# Patient Record
Sex: Male | Born: 1990 | Hispanic: Yes | Marital: Single | Smoking: Former smoker
Health system: Southern US, Community
[De-identification: ages and names within clinical notes are randomized; demographics above are authoritative.]

## PROBLEM LIST (undated history)

## (undated) DIAGNOSIS — R809 Proteinuria, unspecified: Secondary | ICD-10-CM

## (undated) DIAGNOSIS — J039 Acute tonsillitis, unspecified: Secondary | ICD-10-CM

## (undated) DIAGNOSIS — K219 Gastro-esophageal reflux disease without esophagitis: Secondary | ICD-10-CM

## (undated) DIAGNOSIS — R0609 Other forms of dyspnea: Secondary | ICD-10-CM

## (undated) DIAGNOSIS — R748 Abnormal levels of other serum enzymes: Secondary | ICD-10-CM

## (undated) DIAGNOSIS — R7303 Prediabetes: Secondary | ICD-10-CM

## (undated) DIAGNOSIS — R739 Hyperglycemia, unspecified: Secondary | ICD-10-CM

## (undated) DIAGNOSIS — M8588 Other specified disorders of bone density and structure, other site: Secondary | ICD-10-CM

## (undated) DIAGNOSIS — I517 Cardiomegaly: Secondary | ICD-10-CM

## (undated) DIAGNOSIS — R824 Acetonuria: Secondary | ICD-10-CM

## (undated) HISTORY — DX: Other specified disorders of bone density and structure, other site: M85.88

## (undated) HISTORY — DX: Gastro-esophageal reflux disease without esophagitis: K21.9

## (undated) HISTORY — DX: Acute tonsillitis, unspecified: J03.90

## (undated) HISTORY — DX: Prediabetes: R73.03

## (undated) HISTORY — DX: Hyperglycemia, unspecified: R73.9

## (undated) HISTORY — DX: Hypocalcemia: E83.51

## (undated) HISTORY — DX: Abnormal levels of other serum enzymes: R74.8

## (undated) HISTORY — PX: TONSILLECTOMY: SUR1361

## (undated) HISTORY — DX: Other forms of dyspnea: R06.09

## (undated) HISTORY — DX: Cardiomegaly: I51.7

## (undated) HISTORY — DX: Proteinuria, unspecified: R80.9

## (undated) HISTORY — DX: Acetonuria: R82.4

---

## 2011-05-25 ENCOUNTER — Emergency Department (HOSPITAL_COMMUNITY): Payer: Self-pay

## 2011-05-25 ENCOUNTER — Emergency Department (HOSPITAL_COMMUNITY)
Admission: EM | Admit: 2011-05-25 | Discharge: 2011-05-26 | Disposition: A | Discharge status: Home / Self Care | Payer: Self-pay | Attending: Emergency Medicine | Admitting: Emergency Medicine

## 2011-05-25 DIAGNOSIS — M25439 Effusion, unspecified wrist: Secondary | ICD-10-CM | POA: Insufficient documentation

## 2011-05-25 DIAGNOSIS — S139XXA Sprain of joints and ligaments of unspecified parts of neck, initial encounter: Secondary | ICD-10-CM | POA: Insufficient documentation

## 2011-05-25 DIAGNOSIS — R51 Headache: Secondary | ICD-10-CM | POA: Insufficient documentation

## 2011-05-25 DIAGNOSIS — M542 Cervicalgia: Secondary | ICD-10-CM | POA: Insufficient documentation

## 2011-05-25 DIAGNOSIS — M25519 Pain in unspecified shoulder: Secondary | ICD-10-CM | POA: Insufficient documentation

## 2011-05-25 DIAGNOSIS — H5789 Other specified disorders of eye and adnexa: Secondary | ICD-10-CM | POA: Insufficient documentation

## 2011-05-25 DIAGNOSIS — M25539 Pain in unspecified wrist: Secondary | ICD-10-CM | POA: Insufficient documentation

## 2011-05-25 DIAGNOSIS — R079 Chest pain, unspecified: Secondary | ICD-10-CM | POA: Insufficient documentation

## 2011-05-25 DIAGNOSIS — S0510XA Contusion of eyeball and orbital tissues, unspecified eye, initial encounter: Secondary | ICD-10-CM | POA: Insufficient documentation

## 2011-05-25 DIAGNOSIS — S022XXA Fracture of nasal bones, initial encounter for closed fracture: Secondary | ICD-10-CM | POA: Insufficient documentation

## 2011-05-25 DIAGNOSIS — R109 Unspecified abdominal pain: Secondary | ICD-10-CM | POA: Insufficient documentation

## 2011-05-25 DIAGNOSIS — S0180XA Unspecified open wound of other part of head, initial encounter: Secondary | ICD-10-CM | POA: Insufficient documentation

## 2011-05-25 DIAGNOSIS — S63509A Unspecified sprain of unspecified wrist, initial encounter: Secondary | ICD-10-CM | POA: Insufficient documentation

## 2011-05-25 DIAGNOSIS — R10819 Abdominal tenderness, unspecified site: Secondary | ICD-10-CM | POA: Insufficient documentation

## 2011-05-25 DIAGNOSIS — IMO0002 Reserved for concepts with insufficient information to code with codable children: Secondary | ICD-10-CM | POA: Insufficient documentation

## 2011-05-25 LAB — ETHANOL: Alcohol, Ethyl (B): <11 mg/dL (ref 0–11)

## 2011-05-25 LAB — POCT I-STAT, CHEM 8
BUN: 21 mg/dL (ref 6–23)
Chloride: 109 mEq/L (ref 96–112)
Creatinine, Ser: 1 mg/dL (ref 0.50–1.35)
Sodium: 143 mEq/L (ref 135–145)
TCO2: 21 mmol/L (ref 0–100)

## 2011-05-25 LAB — CBC
HCT: 42.3 % (ref 39.0–52.0)
Hemoglobin: 15 g/dL (ref 13.0–17.0)
MCH: 29.2 pg (ref 26.0–34.0)
MCHC: 35.5 g/dL (ref 30.0–36.0)
MCV: 82.3 fL (ref 78.0–100.0)

## 2011-05-25 LAB — DIFFERENTIAL
Basophils Relative: 0 % (ref 0–1)
Lymphs Abs: 2.5 10*3/uL (ref 0.7–4.0)
Monocytes Absolute: 1.4 10*3/uL — ABNORMAL HIGH (ref 0.1–1.0)
Monocytes Relative: 9 % (ref 3–12)
Neutro Abs: 12.9 10*3/uL — ABNORMAL HIGH (ref 1.7–7.7)

## 2011-05-25 MED ORDER — IOHEXOL 300 MG/ML  SOLN
100.0000 mL | Freq: Once | INTRAMUSCULAR | Status: AC | PRN
  Administered 2011-05-25: 100 mL via INTRAVENOUS

## 2018-01-24 ENCOUNTER — Other Ambulatory Visit: Payer: Self-pay

## 2018-01-24 ENCOUNTER — Emergency Department (HOSPITAL_COMMUNITY)
Admission: EM | Admit: 2018-01-24 | Discharge: 2018-01-24 | Disposition: A | Discharge status: Home / Self Care | Payer: Self-pay | Attending: Emergency Medicine | Admitting: Emergency Medicine

## 2018-01-24 ENCOUNTER — Encounter (HOSPITAL_COMMUNITY): Payer: Self-pay

## 2018-01-24 DIAGNOSIS — J029 Acute pharyngitis, unspecified: Secondary | ICD-10-CM | POA: Insufficient documentation

## 2018-01-24 DIAGNOSIS — S00502A Unspecified superficial injury of oral cavity, initial encounter: Secondary | ICD-10-CM | POA: Insufficient documentation

## 2018-01-24 DIAGNOSIS — Z87891 Personal history of nicotine dependence: Secondary | ICD-10-CM | POA: Insufficient documentation

## 2018-01-24 DIAGNOSIS — S0993XA Unspecified injury of face, initial encounter: Secondary | ICD-10-CM

## 2018-01-24 DIAGNOSIS — Y33XXXA Other specified events, undetermined intent, initial encounter: Secondary | ICD-10-CM | POA: Insufficient documentation

## 2018-01-24 DIAGNOSIS — J351 Hypertrophy of tonsils: Secondary | ICD-10-CM | POA: Insufficient documentation

## 2018-01-24 DIAGNOSIS — Y998 Other external cause status: Secondary | ICD-10-CM | POA: Insufficient documentation

## 2018-01-24 DIAGNOSIS — Y939 Activity, unspecified: Secondary | ICD-10-CM | POA: Insufficient documentation

## 2018-01-24 DIAGNOSIS — Y929 Unspecified place or not applicable: Secondary | ICD-10-CM | POA: Insufficient documentation

## 2018-01-24 LAB — CBC WITH DIFFERENTIAL/PLATELET
Basophils Absolute: 0 10*3/uL (ref 0.0–0.1)
Basophils Relative: 0 %
Eosinophils Absolute: 0.1 10*3/uL (ref 0.0–0.7)
Eosinophils Relative: 1 %
HCT: 48 % (ref 39.0–52.0)
HEMOGLOBIN: 16.1 g/dL (ref 13.0–17.0)
LYMPHS ABS: 3.5 10*3/uL (ref 0.7–4.0)
LYMPHS PCT: 35 %
MCH: 28.4 pg (ref 26.0–34.0)
MCHC: 33.5 g/dL (ref 30.0–36.0)
MCV: 84.7 fL (ref 78.0–100.0)
Monocytes Absolute: 0.9 10*3/uL (ref 0.1–1.0)
Monocytes Relative: 9 %
NEUTROS PCT: 55 %
Neutro Abs: 5.7 10*3/uL (ref 1.7–7.7)
Platelets: 207 10*3/uL (ref 150–400)
RBC: 5.67 MIL/uL (ref 4.22–5.81)
RDW: 13.6 % (ref 11.5–15.5)
WBC: 10.2 10*3/uL (ref 4.0–10.5)

## 2018-01-24 MED ORDER — SALINE SPRAY 0.65 % NA SOLN: 1.0000 | NASAL | 0 refills | Status: DC | PRN

## 2018-01-24 MED ORDER — DEXAMETHASONE SODIUM PHOSPHATE 10 MG/ML IJ SOLN
10.0000 mg | Freq: Once | INTRAMUSCULAR | Status: AC
  Administered 2018-01-24: 10 mg via INTRAMUSCULAR
  Filled 2018-01-24: qty 1

## 2018-01-24 MED ORDER — MAGIC MOUTHWASH W/LIDOCAINE: 5.0000 mL | Freq: Three times a day (TID) | ORAL | 0 refills | Status: DC | PRN

## 2018-01-24 NOTE — ED Provider Notes (Signed)
I received a call from [facility] regarding the formulation for Magic mouthwash with lidocaine.  I advised that patient not need the Maalox, but to fill the rest of the normal formulation with lidocaine.   Emi Holes, PA-C 01/24/18 2051    Shaune Pollack, MD 01/25/18 [phone]

## 2018-01-24 NOTE — ED Provider Notes (Signed)
[facility]-EMERGENCY DEPT Provider Note   CSN: [id number] Arrival date & time: 01/24/18  4098     History   Chief Complaint Chief Complaint  Patient presents with  . Sore Throat    HPI Riley Zavala is a 27 y.o. male who presents to the emergency department with a chief complaint of sore throat for the last 3 months.  He reports that he was seen at a medical clinic on [facility] about 1 month ago and was given antibiotics for his sore throat.  He states that he is scheduled with a surgeon at a clinic that he is unsure of the name of her unsure of the surgeons name in [place] to have a tonsillectomy on May 15.  He reports that he has gotten more afraid of having the surgery due to the anesthesia as the day gets closer.  He is concerned about his blood pressure.  He does not have a primary care provider.  He reports that his sore throat is gradually worsened over the last 3 months.  He denies muffled voice, drooling, trismus, neck or facial swelling, fever, chills, respiratory distress, or feeling as if his throat is closing.  He reports increased pain with swallowing food, but reports he has been drinking well.  No weight loss.  He also endorses a painful rash to the roof of the mouth for the last month on the soft palate.  Pain is worse with eating food.  No systemic rash, fever, chills, otalgia, visual changes.  No sick contacts.  No chronic medical problems or daily medications.  Spanish interpreter used.  The patient is a poor historian.  The history is provided by the patient and a relative. A language interpreter was used.    History reviewed. No pertinent past medical history.  There are no active problems to display for this patient.   History reviewed. No pertinent surgical history.      Home Medications    Prior to Admission medications   Medication Sig Start Date End Date Taking? Authorizing Provider  magic mouthwash  w/lidocaine SOLN Take 5 mLs by mouth 3 (three) times daily as needed for mouth pain. 01/24/18   Gunnard Dorrance A, PA-C  sodium chloride (OCEAN) 0.65 % SOLN nasal spray Place 1 spray into both nostrils as needed for congestion. 01/24/18   Jianni Shelden A, PA-C    Family History Family History  Problem Relation Age of Onset  . Diabetes Mother     Social History Social History   Tobacco Use  . Smoking status: Former Games developer  . Smokeless tobacco: Never Used  Substance Use Topics  . Alcohol use: Not Currently  . Drug use: Not Currently     Allergies   Patient has no known allergies.   Review of Systems Review of Systems  Constitutional: Negative for activity change, appetite change and fever.  HENT: Positive for sore throat. Negative for congestion, drooling, facial swelling, mouth sores, postnasal drip, rhinorrhea, sinus pressure, sinus pain, tinnitus, trouble swallowing and voice change.   Eyes: Negative for visual disturbance.  Respiratory: Negative for shortness of breath.   Cardiovascular: Negative for chest pain.  Gastrointestinal: Negative for abdominal pain, diarrhea, nausea and vomiting.  Genitourinary: Negative for dysuria.  Musculoskeletal: Negative for back pain.  Skin: Positive for rash.  Allergic/Immunologic: Negative for immunocompromised state.  Neurological: Negative for headaches.  Psychiatric/Behavioral: Negative for confusion.     Physical Exam Updated Vital Signs BP 117/80   Pulse 66  Temp 98.5 F (36.9 C) (Oral)   Resp 18   Wt 91.2 kg (201 lb)   SpO2 98%   Physical Exam  Constitutional: He appears well-developed and well-nourished. He does not appear ill. No distress.  HENT:  Head: Normocephalic.  Right Ear: Tympanic membrane and ear canal normal.  Left Ear: Tympanic membrane and ear canal normal.  Mouth/Throat: Uvula is midline, oropharynx is clear and moist and mucous membranes are normal. No oral lesions. No uvula swelling. No oropharyngeal  exudate, posterior oropharyngeal edema, posterior oropharyngeal erythema or tonsillar abscesses. Tonsils are 2+ on the right. Tonsils are 2+ on the left. No tonsillar exudate.  No sublingual edema or induration.  There is cobblestoning present to the soft palate.  It does not extend to the hard palate or posterior oropharynx.  No anterior posterior cervical lymphadenopathy.   Tonsils are 2+ and symmetric.  Swallowing without difficulty.  No trismus.  Uvula is midline.  Eyes: Pupils are equal, round, and reactive to light. Conjunctivae and EOM are normal.  Neck: Normal range of motion. Neck supple. No thyromegaly present.  Cardiovascular: Normal rate, regular rhythm, normal heart sounds and intact distal pulses. Exam reveals no gallop and no friction rub.  No murmur heard. Pulmonary/Chest: Effort normal and breath sounds normal. No stridor. No respiratory distress. He has no wheezes. He has no rales. He exhibits no tenderness.  Speaking in complete fluent sentences.  No tachypnea.  Abdominal: Soft. He exhibits no distension and no mass. There is no tenderness. There is no rebound and no guarding. No hernia.  Lymphadenopathy:    He has no cervical adenopathy.  Neurological: He is alert.  Skin: Skin is warm and dry. Capillary refill takes less than 2 seconds. No rash noted.  No rash or lesions aside from the soft palate.  Psychiatric: His behavior is normal.  Nursing note and vitals reviewed.    ED Treatments / Results  Labs (all labs ordered are listed, but only abnormal results are displayed) Labs Reviewed  RAPID STREP SCREEN ([facility] & [facility] ONLY)  CBC WITH DIFFERENTIAL/PLATELET    EKG None  Radiology No results found.  Procedures Procedures (including critical care time)  Medications Ordered in ED Medications  dexamethasone (DECADRON) injection 10 mg (10 mg Intramuscular Given 01/24/18 1519)     Initial Impression / Assessment and Plan / ED Course  I have reviewed the triage  vital signs and the nursing notes.  Pertinent labs & imaging results that were available during my care of the patient were reviewed by me and considered in my medical decision making (see chart for details).     27 year old male who is a poor historian presenting with sore throat for the last 3 months and irritation to the soft palate for the last month.  He is scheduled for a tonsillectomy on May 15 by an ENT surgeon in [place], but he is unsure of the surgeon's name or the name of the practice.  On exam, he has mild cobblestoning to the soft palate.  Tonsils are 2+ and symmetric.  No appreciable anterior posterior lymphadenopathy.  His symptoms appear chronic.  He is tolerating fluids and eating without difficulty in the ED.  No sublingual induration or edema.  Doubt Ludwig's angina, peritonsillar abscess, retropharyngeal abscess.  Rapid strep is negative.  No leukocytosis or elevated neutrophil count or bandemia on CBC.  The patient was assessed along with Dr. Clarice Pole, attending physician due to cobblestoning on the soft palate.  We will discharge  the patient to home with Magic mouthwash and soft diet to see if his symptoms improve over the next week.  Recommended following up with his surgeon for reevaluation prior to his surgery on May 15.  He is hemodynamically stable at this time and in no acute distress.  He is safe for discharge to home with outpatient follow-up.  Final Clinical Impressions(s) / ED Diagnoses   Final diagnoses:  Enlarged tonsils  Soft palate injury, initial encounter    ED Discharge Orders        Ordered    sodium chloride (OCEAN) 0.65 % SOLN nasal spray  As needed     01/24/18 1608    magic mouthwash w/lidocaine SOLN  3 times daily PRN     01/24/18 1608       Emerly Prak A, PA-C 01/24/18 1729    Arby Barrette, MD 01/28/18 1447

## 2018-01-24 NOTE — ED Triage Notes (Addendum)
Patient c/o sore throat and tongue swelling x 3 months. Patient states the roof of his mouth feels painful. Patient states he feels like his throat is swelling shut. Patient is able to swallow liquids, but no food. Patient added that he went to a clinic 1 month ago and was given antibiotics,but the symptoms are worsening.  Video interpreter Noemi [phone] used/spanish

## 2018-01-24 NOTE — Discharge Instructions (Addendum)
El hisopo de la parte posterior de la garganta fue negativo para la faringitis estreptoccica, lo que significa que no necesita antibiticos hoy.  Por favor, mantenga su cita de seguimiento con su cirujano para que [employer].  Evite los alimentos picantes y calientes durante la prxima semana para ver si la irritacin en la parte superior de su boca mejora. Trate de comer alimentos ms suaves como el pur de papas, el helado y el pan para ver si su dolor mejora. Swish y escupir 5 ml de Magic enjuague bucal cada 8 horas segn sea necesario. Puede tomar 600 mg de ibuprofeno con alimentos cada 6 horas para el dolor.  Tambin puedes hacer grgaras con agua tibia con sal, pero asegrate de escupirla.  Para su Flossie Dibble, puede insertar 1 atomizador de Ocean nasal spray, que est disponible sin receta, y cada orificio nasal. Asegrate de beber mucha agua mientras trabajas para evitar deshidratarte si tu garganta se seca.  Si presenta sntomas de empeoramiento, incluso si no puede tragar agua, si siente que su garganta se est cerrando, si tiene fiebre alta, voz apagada, hinchazn significativa en un lado del cuello o la cara, u otros sntomas nuevos relacionados , por favor regrese al departamento de emergencias para su reevaluacin.    Send   The swab of the back of your throat was negative for strep throat, which means you do not need antibiotics today.  Please keep your follow-up appointment with your surgeon to have your tonsils removed.  Avoid hot and spicy foods over the next week to see if the irritation on the top of her mouth improves.  Try eating softer foods such as mashed potatoes, ice cream, and bread to see if your pain improves.  Swish and spit 5 mL of Magic mouthwash every 8 hours as needed. You can take 600 mg of ibuprofen with food every 6 hours for pain.   You can also try gargling warm salt water, but make sure to spit it out.  For your dry nose, you can insert 1  spray of Ocean nasal spray, which is available over-the-counter, and each nostril.  Make sure to drink plenty of water while you are work to avoid getting dehydrated if your throat is getting dry.  If you develop worsening symptoms including if you are unable to swallow water, if you feel as if your throat is closing, if you develop high fever, muffled voice, develop significant swelling to one side of the neck or face, or other new concerning symptoms, please return to the emergency department for re-evaluation.

## 2018-06-22 ENCOUNTER — Emergency Department (HOSPITAL_COMMUNITY)
Admission: EM | Admit: 2018-06-22 | Discharge: 2018-06-22 | Disposition: A | Discharge status: Home / Self Care | Payer: Self-pay | Attending: Emergency Medicine | Admitting: Emergency Medicine

## 2018-06-22 ENCOUNTER — Encounter (HOSPITAL_COMMUNITY): Payer: Self-pay | Admitting: Emergency Medicine

## 2018-06-22 ENCOUNTER — Other Ambulatory Visit: Payer: Self-pay

## 2018-06-22 DIAGNOSIS — Z87891 Personal history of nicotine dependence: Secondary | ICD-10-CM | POA: Insufficient documentation

## 2018-06-22 DIAGNOSIS — Z9089 Acquired absence of other organs: Secondary | ICD-10-CM | POA: Insufficient documentation

## 2018-06-22 DIAGNOSIS — J95830 Postprocedural hemorrhage and hematoma of a respiratory system organ or structure following a respiratory system procedure: Secondary | ICD-10-CM | POA: Insufficient documentation

## 2018-06-22 LAB — BASIC METABOLIC PANEL
Anion gap: 8 (ref 5–15)
BUN: 10 mg/dL (ref 6–20)
CO2: 27 mmol/L (ref 22–32)
Calcium: 9.8 mg/dL (ref 8.9–10.3)
Chloride: 108 mmol/L (ref 98–111)
Creatinine, Ser: 0.73 mg/dL (ref 0.61–1.24)
GFR calc non Af Amer: >60 mL/min (ref >60)
Glucose, Bld: 118 mg/dL — ABNORMAL HIGH (ref 70–99)
Potassium: 3.7 mmol/L (ref 3.5–5.1)
SODIUM: 143 mmol/L (ref 135–145)

## 2018-06-22 LAB — CBC WITH DIFFERENTIAL/PLATELET
BASOS ABS: 0 10*3/uL (ref 0.0–0.1)
Basophils Relative: 0 %
EOS ABS: 0.1 10*3/uL (ref 0.0–0.7)
Eosinophils Relative: 1 %
HCT: 44.3 % (ref 39.0–52.0)
Hemoglobin: 14.8 g/dL (ref 13.0–17.0)
Lymphocytes Relative: 39 %
Lymphs Abs: 3.6 10*3/uL (ref 0.7–4.0)
MCH: 28.1 pg (ref 26.0–34.0)
MCHC: 33.4 g/dL (ref 30.0–36.0)
MCV: 84.1 fL (ref 78.0–100.0)
Monocytes Absolute: 0.9 10*3/uL (ref 0.1–1.0)
Monocytes Relative: 9 %
NEUTROS PCT: 51 %
Neutro Abs: 4.7 10*3/uL (ref 1.7–7.7)
Platelets: 237 10*3/uL (ref 150–400)
RBC: 5.27 MIL/uL (ref 4.22–5.81)
RDW: 13.1 % (ref 11.5–15.5)
WBC: 9.3 10*3/uL (ref 4.0–10.5)

## 2018-06-22 LAB — I-STAT CHEM 8, ED
BUN: 25 mg/dL — ABNORMAL HIGH (ref 6–20)
CHLORIDE: 110 mmol/L (ref 98–111)
Calcium, Ion: 1.22 mmol/L (ref 1.15–1.40)
Creatinine, Ser: 0.7 mg/dL (ref 0.61–1.24)
Glucose, Bld: 124 mg/dL — ABNORMAL HIGH (ref 70–99)
HCT: 37 % — ABNORMAL LOW (ref 39.0–52.0)
HEMOGLOBIN: 12.6 g/dL — AB (ref 13.0–17.0)
POTASSIUM: 4.7 mmol/L (ref 3.5–5.1)
SODIUM: 141 mmol/L (ref 135–145)
TCO2: 25 mmol/L (ref 22–32)

## 2018-06-22 MED ORDER — TRANEXAMIC ACID 1000 MG/10ML IV SOLN
500.0000 mg | Freq: Once | INTRAVENOUS | Status: AC
  Administered 2018-06-22: 1000 mg via TOPICAL
  Filled 2018-06-22 (×2): qty 10

## 2018-06-22 NOTE — Consult Note (Signed)
[facility] OTOLARYNGOLOGY CONSULTATION  Referring Physician: Dr. Clayborne Dana Primary Care Physician: Patient, [name] Patient Location at Initial Consult: Emergency Department Chief Complaint/Reason for Consult: post operative oropharyngeal hemorrhage  History of Presenting Illness:  History obtained from patient, EMR Riley Zavala is a  27 y.o. male presenting with  Small amount of bleeding from the mouth. Began acutely at 3am. He underwent tonsillectomy for snoring and obstructive sleep symptoms on 06/07/18 at an outpatient surgical center in [facility] and Throat. Has been hemostatic since presenting to the ED. Hgb 14.8. Never had bright red blood coming from the mouth, but did have some clots that he was spitting up. No vomiting. Pain controlled. He is now POD 15. Tolerating regular diet, staying hydrated. States he thought he got an infection about one week ago and was given amoxicillin.   No difficulty breathing.   History reviewed. No pertinent past medical history.  History reviewed. No pertinent surgical history.  Family History  Problem Relation Age of Onset  . Diabetes Mother     Social History   Socioeconomic History  . Marital status: Single    Spouse name: Not on file  . Number of children: Not on file  . Years of education: Not on file  . Highest education level: Not on file  Occupational History  . Not on file  Social Needs  . Financial resource strain: Not on file  . Food insecurity:    Worry: Not on file    Inability: Not on file  . Transportation needs:    Medical: Not on file    Non-medical: Not on file  Tobacco Use  . Smoking status: Former Games developer  . Smokeless tobacco: Never Used  Substance and Sexual Activity  . Alcohol use: Not Currently  . Drug use: Not Currently  . Sexual activity: Not on file  Lifestyle  . Physical activity:    Days per week: Not on file    Minutes per session: Not on  file  . Stress: Not on file  Relationships  . Social connections:    Talks on phone: Not on file    Gets together: Not on file    Attends religious service: Not on file    Active member of club or organization: Not on file    Attends meetings of clubs or organizations: Not on file    Relationship status: Not on file  Other Topics Concern  . Not on file  Social History Narrative  . Not on file    No current facility-administered medications on file prior to encounter.    Current Outpatient Medications on File Prior to Encounter  Medication Sig Dispense Refill  . amoxicillin (AMOXIL) 875 MG tablet Take 875 mg by mouth 2 (two) times daily.  0  . magic mouthwash w/lidocaine SOLN Take 5 mLs by mouth 3 (three) times daily as needed for mouth pain. (Patient not taking: Reported on 04/28/2018) 50 mL 0  . sodium chloride (OCEAN) 0.65 % SOLN nasal spray Place 1 spray into both nostrils as needed for congestion. (Patient not taking: Reported on 06/22/2018) 88 mL 0    No Known Allergies   Review of Systems: ROS completed, negative except for the above.    OBJECTIVE: Vital Signs: Vitals:   06/22/18 0600 06/22/18 0630  BP: 127/79 132/86  Pulse: (!) 112 (!) 109  Resp: 18 (!) 22  Temp:    SpO2: 97% 97%  I&O No intake or output data in the 24 hours ending 06/22/18 0835  Physical Exam General: Well developed, well nourished. No acute distress. Voice normal. No active bleeding  Head/Face: Normocephalic, atraumatic. No scars or lesions. No sinus tenderness. Facial nerve intact and equal bilaterally.  No facial lacerations. Salivary glands non tender and without palpable masses  Eyes: Globes well positioned, no proptosis Lids: No periorbital edema/ecchymosis. No lid laceration Conjunctiva: No chemosis, hemorrhage PERRl, EOMI  Ears: No gross deformity. Normal external canal. Tympanic membrane intact bilaterally  Hearing: Normal speech reception.  Nose: No gross deformity or lesions. No  purulent discharge. Septum midline. No turbinate hypertrophy.  Mouth/Oropharynx: Lips without any lesions. Dentition good. No mucosal lesions within the oropharynx.   Pharyngeal walls symmetrical. Uvula midline. Tongue midline without lesions. Tonsillar beds are well-healed. Small excoriation on the right side of the uvula. There is a small, punctate area of granulation tissue and partial small (3mm) clot in the right superior pole. No active bleeding, no large blood clots.   Neck: Trachea midline. No masses. No thyromegaly or nodules palpated. No crepitus.  Lymphatic: No lymphadenopathy in the neck.  Respiratory: No stridor or distress.  Cardiovascular: Regular rate and rhythm.  Extremities: No edema or cyanosis. Warm and well-perfused.  Skin: No scars or lesions on face or neck.  Neurologic: CN II-XII intact. Moving all extremities without gross abnormality.  Other:      Labs: Lab Results  Component Value Date   WBC 9.3 06/22/2018   HGB 14.8 06/22/2018   HCT 44.3 06/22/2018   PLT 237 06/22/2018   NA 143 06/22/2018   K 3.7 06/22/2018   CL 108 06/22/2018   CREATININE 0.73 06/22/2018   BUN 10 06/22/2018   CO2 27 06/22/2018     Review of Ancillary Data / Diagnostic Tests: Hgb 14.8 Patient transferred from [facility]  ASSESSMENT:  27 y.o. male with now hemostatic post-tonsillectomy hemorrhage on POD 15. No active bleeding or large clots noted.   RECOMMENDATIONS: -Dr. Clayborne Dana is administering nebulized TXA.  -Not currently bleeding. Would keep patient NPO for a new hours after TXA, continued observation -If still not bleeding, may be able to go home this afternoon or early evening with strict return precautions. -Call ENT if any further bleeding is noted.  -Followup with home surgeon in 2-3 days    Misty Stanley, MD  [facility] Office phone [phone]

## 2018-06-22 NOTE — ED Notes (Signed)
Patient verbalizes understanding of discharge instructions. Opportunity for questioning and answers were provided. Armband removed by staff, pt discharged from ED.  

## 2018-06-22 NOTE — ED Notes (Addendum)
With interpreter: Pt states had tonsils removed on 9/10, followed up with surgeon yesterday. Pt states woke up at 0330 this morning bleeding from throat. Pt denies touching the incisions in the back of his throat, denies eating anything that could aggravate the incision.

## 2018-06-22 NOTE — ED Notes (Signed)
Spoke to [place], Consulting civil engineerCharge RN at [facility] to give brief report on pt.

## 2018-06-22 NOTE — ED Notes (Signed)
Report has been called to [facility].

## 2018-06-22 NOTE — ED Triage Notes (Signed)
Patient is complaining of bleeding from mouth. Patient states he had his tonsils taken out 06/07/2018. Patient states he started bleeding around 3:30 am today.

## 2018-06-22 NOTE — ED Notes (Signed)
Rec'd trnsf from [facility]; with c/o bleeding from mouth; s/p tonsilectomy; no complications; patent airway and vitals stable; 112/72, 101, 16, 98% on RA; no pain; pt speaks [place] but understands some [place]

## 2018-06-22 NOTE — ED Provider Notes (Addendum)
8:08 AM Assumed care from Dr. Tomi Bamberger at [facility], please see their note for full history, physical and decision making until this point. In brief this is a 27 y.o. year old male who presented to the ED tonight with Post-op Problem  Transferred from [facility] per Dr. Janace Hoard request as he does not perform operations at [facility] apparently.  On my exam patient is slightly tachycardic but not hypotensive.  Still has some oozing from the right tonsillar fossa.  Paged Dr. Janace Hoard who stated at this point time I need to page a different ear nose and throat doctor as he was no longer on call.  Paged on-call ear nose and throat doctor [phone].  Order TXA and will give it nebulized to see if it helps.  0807: Not heard back from ear nose and throat doctor will re-page.  Dr. Blenda Nicely saw patient in ER. Just oozing/improved. Will observe.   Patient with elevated HR still, repeat hemoglobin without significant drop, will allow to eat.   PO challenge met. HR improved. Stable for dc.   Discharge instructions, including strict return precautions for new or worsening symptoms, given. Patient and/or family verbalized understanding and agreement with the plan as described.   Labs, studies and imaging reviewed by myself and considered in medical decision making if ordered. Imaging interpreted by radiology.  Labs Reviewed  BASIC METABOLIC PANEL - Abnormal; Notable for the following components:      Result Value   Glucose, Bld 118 (*)    All other components within normal limits  I-STAT CHEM 8, ED - Abnormal; Notable for the following components:   BUN 25 (*)    Glucose, Bld 124 (*)    Hemoglobin 12.6 (*)    HCT 37.0 (*)    All other components within normal limits  CBC WITH DIFFERENTIAL/PLATELET    No orders to display    No follow-ups on file.    Ford Peddie, Corene Cornea, MD 06/22/18 [phone]

## 2018-06-22 NOTE — ED Provider Notes (Signed)
[facility]-EMERGENCY DEPT Provider Note   CSN: [id number] Arrival date & time: 06/22/18  0415  Time seen 05:11 AM   History   Chief Complaint Chief Complaint  Patient presents with  . Post-op Problem    HPI Riley Zavala is a 27 y.o. male.   Spanish Ronny Flurrynterpretor Riley Zavala was used.   HPI patient states he had a tonsillectomy done on September 10, a Tuesday [place].  He cannot tell me who his surgeon was.  No one is at home to look at his pill bottles to get the name.  He also left his discharge papers at home.  He had a follow-up appointment yesterday and everything had been doing well.  He states he awakened at 330 this morning with some bleeding.  He feels like it is coming from the top of his mouth in the middle.  He denies any pain.  PCP Patient, [name]   History reviewed. No pertinent past medical history.  There are no active problems to display for this patient.   History reviewed. No pertinent surgical history.      Home Medications    Prior to Admission medications   Medication Sig Start Date End Date Taking? Authorizing Provider  amoxicillin (AMOXIL) 875 MG tablet Take 875 mg by mouth 2 (two) times daily. 06/15/18  Yes [provider]  magic mouthwash w/lidocaine SOLN Take 5 mLs by mouth 3 (three) times daily as needed for mouth pain. Patient not taking: Reported on 04/28/2018 01/24/18   McDonald, Pedro EarlsMia A, PA-C  sodium chloride (OCEAN) 0.65 % SOLN nasal spray Place 1 spray into both nostrils as needed for congestion. Patient not taking: Reported on 06/22/2018 01/24/18   Barkley BoardsMcDonald, Mia A, PA-C    Family History Family History  Problem Relation Age of Onset  . Diabetes Mother     Social History Social History   Tobacco Use  . Smoking status: Former Games developermoker  . Smokeless tobacco: Never Used  Substance Use Topics  . Alcohol use: Not Currently  . Drug use: Not Currently      Allergies   Patient has no known allergies.   Review of Systems Review of Systems  All other systems reviewed and are negative.    Physical Exam Updated Vital Signs BP (!) 150/113   Pulse 78   Temp 99.2 F (37.3 C) (Oral)   Resp 19   Ht 5\' 7"  (1.702 m)   Wt 90.7 kg   SpO2 94%   BMI 31.32 kg/m   Vital signs normal except for hypertension   Physical Exam  Constitutional: He is oriented to person, place, and time. He appears well-developed and well-nourished.  HENT:  Head: Normocephalic and atraumatic.  Right Ear: External ear normal.  Left Ear: External ear normal.  Nose: Nose normal.  When I examined the patient's oropharynx there is no abnormal swelling, purulent areas, and the uvula is midline.  There does not appear to be some blood dripping down from the the right side of the soft palate/tonsillar area.  Patient is using a suction and he is having a slow oozing, he is not hemorrhaging.  Eyes: Conjunctivae and EOM are normal.  Neck: Normal range of motion.  Cardiovascular: Normal rate.  Pulmonary/Chest: Effort normal. No respiratory distress.  Musculoskeletal: Normal range of motion.  Neurological: He is alert and oriented to person, place, and time. No cranial nerve deficit.  Skin: Skin is warm and dry.  Psychiatric: He has a normal mood and affect. His behavior is normal. Thought content normal.  Nursing note and vitals reviewed.    ED Treatments / Results  Labs (all labs ordered are listed, but only abnormal results are displayed) Labs Reviewed - No data to display  EKG None  Radiology No results found.  Procedures Procedures (including critical care time)  Medications Ordered in ED Medications - No data to display   Initial Impression / Assessment and Plan / ED Course  I have reviewed the triage vital signs and the nursing notes.  Pertinent labs & imaging results that were available during my care of the patient were reviewed by me and  considered in my medical decision making (see chart for details).     Patient was discussed with Dr. Jearld Fenton, ENT at 6:05 AM.  He wants the patient to be transferred to [facility], ED for him to evaluate, he states he does not use the operating room at [facility].  [place], Press photographer at [employer] was contacted at 6:23 and accepts the patient in transfer.  6:25 AM Dr. Consuella Lose, ED physician made aware of transfer and need to call Dr. Jearld Fenton on arrival.  Patient made aware of need for transfer.  Final Clinical Impressions(s) / ED Diagnoses   Final diagnoses:  Post-tonsillectomy hemorrhage    Pt transferred to [facility] ED to see Dr Verdene Lennert, MD, Iline Oven, Jodelle Gross, MD 06/22/18 [phone]

## 2018-06-22 NOTE — ED Notes (Signed)
Throat is very red, with copious amounts of blood present

## 2019-02-28 ENCOUNTER — Encounter (HOSPITAL_COMMUNITY): Payer: Self-pay | Admitting: Emergency Medicine

## 2019-02-28 ENCOUNTER — Other Ambulatory Visit: Payer: Self-pay

## 2019-02-28 ENCOUNTER — Emergency Department (HOSPITAL_COMMUNITY): Payer: HRSA Program

## 2019-02-28 ENCOUNTER — Emergency Department (HOSPITAL_COMMUNITY)
Admission: EM | Admit: 2019-02-28 | Discharge: 2019-02-28 | Disposition: A | Discharge status: Home / Self Care | Payer: HRSA Program | Attending: Emergency Medicine | Admitting: Emergency Medicine

## 2019-02-28 DIAGNOSIS — Z87891 Personal history of nicotine dependence: Secondary | ICD-10-CM | POA: Insufficient documentation

## 2019-02-28 DIAGNOSIS — U071 COVID-19: Secondary | ICD-10-CM | POA: Diagnosis not present

## 2019-02-28 DIAGNOSIS — R05 Cough: Secondary | ICD-10-CM | POA: Diagnosis not present

## 2019-02-28 DIAGNOSIS — R509 Fever, unspecified: Secondary | ICD-10-CM | POA: Diagnosis present

## 2019-02-28 LAB — URINALYSIS, ROUTINE W REFLEX MICROSCOPIC
Bacteria, UA: NONE SEEN
Bilirubin Urine: NEGATIVE
Glucose, UA: NEGATIVE mg/dL
Hgb urine dipstick: NEGATIVE
Ketones, ur: 80 mg/dL — AB
Leukocytes,Ua: NEGATIVE
Nitrite: NEGATIVE
Protein, ur: 30 mg/dL — AB
Specific Gravity, Urine: 1.028 (ref 1.005–1.030)
pH: 6 (ref 5.0–8.0)

## 2019-02-28 LAB — CBC WITH DIFFERENTIAL/PLATELET
Abs Immature Granulocytes: 0.01 10*3/uL (ref 0.00–0.07)
Basophils Absolute: 0 10*3/uL (ref 0.0–0.1)
Basophils Relative: 0 %
Eosinophils Absolute: 0 10*3/uL (ref 0.0–0.5)
Eosinophils Relative: 0 %
HCT: 46.6 % (ref 39.0–52.0)
Hemoglobin: 15.3 g/dL (ref 13.0–17.0)
Immature Granulocytes: 0 %
Lymphocytes Relative: 34 %
Lymphs Abs: 1.5 10*3/uL (ref 0.7–4.0)
MCH: 27.4 pg (ref 26.0–34.0)
MCHC: 32.8 g/dL (ref 30.0–36.0)
MCV: 83.5 fL (ref 80.0–100.0)
Monocytes Absolute: 0.4 10*3/uL (ref 0.1–1.0)
Monocytes Relative: 9 %
Neutro Abs: 2.5 10*3/uL (ref 1.7–7.7)
Neutrophils Relative %: 57 %
Platelets: 155 10*3/uL (ref 150–400)
RBC: 5.58 MIL/uL (ref 4.22–5.81)
RDW: 13.2 % (ref 11.5–15.5)
WBC: 4.4 10*3/uL (ref 4.0–10.5)
nRBC: 0 % (ref 0.0–0.2)

## 2019-02-28 LAB — COMPREHENSIVE METABOLIC PANEL
ALT: 78 U/L — ABNORMAL HIGH (ref 0–44)
AST: 70 U/L — ABNORMAL HIGH (ref 15–41)
Albumin: 3.9 g/dL (ref 3.5–5.0)
Alkaline Phosphatase: 91 U/L (ref 38–126)
Anion gap: 10 (ref 5–15)
BUN: 10 mg/dL (ref 6–20)
CO2: 24 mmol/L (ref 22–32)
Calcium: 8.7 mg/dL — ABNORMAL LOW (ref 8.9–10.3)
Chloride: 103 mmol/L (ref 98–111)
Creatinine, Ser: 1.01 mg/dL (ref 0.61–1.24)
GFR calc Af Amer: >60 mL/min (ref >60)
GFR calc non Af Amer: >60 mL/min (ref >60)
Glucose, Bld: 102 mg/dL — ABNORMAL HIGH (ref 70–99)
Potassium: 3.5 mmol/L (ref 3.5–5.1)
Sodium: 137 mmol/L (ref 135–145)
Total Bilirubin: 0.5 mg/dL (ref 0.3–1.2)
Total Protein: 7.3 g/dL (ref 6.5–8.1)

## 2019-02-28 LAB — LACTIC ACID, PLASMA: Lactic Acid, Venous: 1.8 mmol/L (ref 0.5–1.9)

## 2019-02-28 LAB — SARS CORONAVIRUS 2 BY RT PCR (HOSPITAL ORDER, PERFORMED IN ~~LOC~~ HOSPITAL LAB): SARS Coronavirus 2: POSITIVE — AB

## 2019-02-28 MED ORDER — SODIUM CHLORIDE 0.9 % IV SOLN
1000.0000 mL | INTRAVENOUS | Status: DC
  Administered 2019-02-28: 1000 mL via INTRAVENOUS

## 2019-02-28 MED ORDER — SODIUM CHLORIDE 0.9% FLUSH: 3.0000 mL | Freq: Once | INTRAVENOUS | Status: DC

## 2019-02-28 MED ORDER — ACETAMINOPHEN 325 MG PO TABS
650.0000 mg | ORAL_TABLET | Freq: Once | ORAL | Status: AC | PRN
Start: 2019-02-28 — End: 2019-02-28
  Administered 2019-02-28: 650 mg via ORAL
  Filled 2019-02-28: qty 2

## 2019-02-28 MED ORDER — SODIUM CHLORIDE 0.9 % IV BOLUS (SEPSIS)
1000.0000 mL | Freq: Once | INTRAVENOUS | Status: AC
  Administered 2019-02-28: 1000 mL via INTRAVENOUS

## 2019-02-28 NOTE — ED Triage Notes (Addendum)
Patient presents to the ED by EMS with c/o a week of fevers, headache, n/v, he is from home. Pt has reports shortness of breath when coughing and that he could be Covid Positive.  EMS temp of 102.2. Pt remains conscious alert and oriented at this time. The interpreter was used to triage the patient.

## 2019-02-28 NOTE — ED Provider Notes (Signed)
[facility] Provider Note   CSN: [id number] Arrival date & time: 02/28/19  1732    History   Chief Complaint Chief Complaint  Patient presents with  . Fever  . Cough  . possible covid    HPI Riley Zavala is a 28 y.o. male.     The history is provided by the patient. No language interpreter was used.  Fever  Max temp prior to arrival:  102 Temp source:  Oral Severity:  Moderate Onset quality:  Gradual Duration:  1 week Timing:  Constant Progression:  Worsening Chronicity:  New Relieved by:  Nothing Worsened by:  Nothing Ineffective treatments:  None tried Associated symptoms: cough   Cough  Associated symptoms: fever   Pt has had a cough for a week.  Pt reports shortness of breath and fever.  Pt reports his roommate has had the same.  History reviewed. No pertinent past medical history.  There are no active problems to display for this patient.   History reviewed. No pertinent surgical history.      Home Medications    Prior to Admission medications   Medication Sig Start Date End Date Taking? Authorizing Provider  amoxicillin (AMOXIL) 875 MG tablet Take 875 mg by mouth 2 (two) times daily. 06/15/18   [provider]    Family History Family History  Problem Relation Age of Onset  . Diabetes Mother     Social History Social History   Tobacco Use  . Smoking status: Former Games developermoker  . Smokeless tobacco: Never Used  Substance Use Topics  . Alcohol use: Not Currently  . Drug use: Not Currently     Allergies   Patient has no known allergies.   Review of Systems Review of Systems  Constitutional: Positive for fever.  Respiratory: Positive for cough.   All other systems reviewed and are negative.    Physical Exam Updated Vital Signs BP 114/73 (BP Location: Right Arm)   Pulse 99   Temp (!) 102.6 F (39.2 C) (Oral)   Resp (!) 21   Ht 5\' 7"  (1.702 m)   Wt 90.7 kg   SpO2 93%    BMI 31.32 kg/m   Physical Exam Vitals signs and nursing note reviewed.  Constitutional:      Appearance: He is well-developed.  HENT:     Head: Normocephalic and atraumatic.  Eyes:     Conjunctiva/sclera: Conjunctivae normal.  Neck:     Musculoskeletal: Neck supple.  Cardiovascular:     Rate and Rhythm: Normal rate and regular rhythm.     Heart sounds: No murmur.  Pulmonary:     Effort: Pulmonary effort is normal. No respiratory distress.     Breath sounds: Normal breath sounds.  Abdominal:     Palpations: Abdomen is soft.     Tenderness: There is no abdominal tenderness.  Musculoskeletal: Normal range of motion.  Skin:    General: Skin is warm and dry.  Neurological:     General: No focal deficit present.     Mental Status: He is alert.  Psychiatric:        Mood and Affect: Mood normal.      ED Treatments / Results  Labs (all labs ordered are listed, but only abnormal results are displayed) Labs Reviewed  CULTURE, BLOOD (ROUTINE X 2)  CULTURE, BLOOD (ROUTINE X 2)  SARS CORONAVIRUS 2 (HOSPITAL ORDER, PERFORMED IN [facility])  LACTIC ACID, PLASMA  LACTIC ACID, PLASMA  COMPREHENSIVE METABOLIC PANEL  CBC WITH DIFFERENTIAL/PLATELET  URINALYSIS, ROUTINE W REFLEX MICROSCOPIC    EKG None  Radiology No results found.  Procedures Procedures (including critical care time)  Medications Ordered in ED Medications  sodium chloride flush (NS) 0.9 % injection 3 mL (has no administration in time range)  acetaminophen (TYLENOL) tablet 650 mg (650 mg Oral Given 02/28/19 1801)     Initial Impression / Assessment and Plan / ED Course  I have reviewed the triage vital signs and the nursing notes.  Pertinent labs & imaging results that were available during my care of the patient were reviewed by me and considered in my medical decision making (see chart for details).        MDM  Pt has positive covid.  Labs show dehydration.  Pt given Iv fluids and  tylenol  Pt feels better. Pt advised to self quarantine.  Pt advised to return if any difficulty breathing   Final Clinical Impressions(s) / ED Diagnoses   Final diagnoses:  COVID-19 virus detected  Fever, unspecified fever cause    ED Discharge Orders    None    An After Visit Summary was printed and given to the patient.     Elson Areas, [place] 02/28/19 2321    Margarita Grizzle, MD 03/01/19 1248

## 2019-02-28 NOTE — ED Notes (Signed)
Pt discharged from ED; instructions provided and scripts given; Pt encouraged to return to ED if symptoms worsen and to f/u with PCP; Pt verbalized understanding of all instructions 

## 2019-02-28 NOTE — ED Notes (Signed)
Pt now states "I get burning in my penis when I pee". Interpreter used and translated the need for urine specimen.

## 2019-02-28 NOTE — ED Notes (Signed)
ED Provider at bedside. 

## 2019-02-28 NOTE — Discharge Instructions (Signed)
Person Under Monitoring Name: Riley Zavala  Location: [address] [place] [id number]   Infection Prevention Recommendations for Individuals Confirmed to have, or Being Evaluated for, 2019 Novel Coronavirus (COVID-19) Infection Who Receive Care at Home  Individuals who are confirmed to have, or are being evaluated for, COVID-19 should follow the prevention steps below until a healthcare provider or local or state health department says they can return to normal activities.  Stay home except to get medical care You should restrict activities outside your home, except for getting medical care. Do not go to work, school, or public areas, and do not use public transportation or taxis.  Call ahead before visiting your doctor Before your medical appointment, call the healthcare provider and tell them that you have, or are being evaluated for, COVID-19 infection. This will help the healthcare providers office take steps to keep other people from getting infected. Ask your healthcare provider to call the local or state health department.  Monitor your symptoms Seek prompt medical attention if your illness is worsening (e.g., difficulty breathing). Before going to your medical appointment, call the healthcare provider and tell them that you have, or are being evaluated for, COVID-19 infection. Ask your healthcare provider to call the local or state health department.  Wear a facemask You should wear a facemask that covers your nose and mouth when you are in the same room with other people and when you visit a healthcare provider. People who live with or visit you should also wear a facemask while they are in the same room with you.  Separate yourself from other people in your home As much as possible, you should stay in a different room from other people in your home. Also, you should use a separate bathroom, if available.  Avoid sharing household  items You should not share dishes, drinking glasses, cups, eating utensils, towels, bedding, or other items with other people in your home. After using these items, you should wash them thoroughly with soap and water.  Cover your coughs and sneezes Cover your mouth and nose with a tissue when you cough or sneeze, or you can cough or sneeze into your sleeve. Throw used tissues in a lined trash can, and immediately wash your hands with soap and water for at least 20 seconds or use an alcohol-based hand rub.  Wash your Union Pacific Corporationhands Wash your hands often and thoroughly with soap and water for at least 20 seconds. You can use an alcohol-based hand sanitizer if soap and water are not available and if your hands are not visibly dirty. Avoid touching your eyes, nose, and mouth with unwashed hands.   Prevention Steps for Caregivers and Household Members of Individuals Confirmed to have, or Being Evaluated for, COVID-19 Infection Being Cared for in the Home  If you live with, or provide care at home for, a person confirmed to have, or being evaluated for, COVID-19 infection please follow these guidelines to prevent infection:  Follow healthcare providers instructions Make sure that you understand and can help the patient follow any healthcare provider instructions for all care.  Provide for the patients basic needs You should help the patient with basic needs in the home and provide support for getting groceries, prescriptions, and other personal needs.  Monitor the patients symptoms If they are getting sicker, call his or her medical provider and tell them that the patient has, or is being evaluated for, COVID-19 infection. This will help the healthcare  providers office take steps to keep other people from getting infected. Ask the healthcare provider to call the local or state health department.  Limit the number of people who have contact with the patient If possible, have only one caregiver  for the patient. Other household members should stay in another home or place of residence. If this is not possible, they should stay in another room, or be separated from the patient as much as possible. Use a separate bathroom, if available. Restrict visitors who do not have an essential need to be in the home.  Keep older adults, very young children, and other sick people away from the patient Keep older adults, very young children, and those who have compromised immune systems or chronic health conditions away from the patient. This includes people with chronic heart, lung, or kidney conditions, diabetes, and cancer.  Ensure good ventilation Make sure that shared spaces in the home have good air flow, such as from an air conditioner or an opened window, weather permitting.  Wash your hands often Wash your hands often and thoroughly with soap and water for at least 20 seconds. You can use an alcohol based hand sanitizer if soap and water are not available and if your hands are not visibly dirty. Avoid touching your eyes, nose, and mouth with unwashed hands. Use disposable paper towels to dry your hands. If not available, use dedicated cloth towels and replace them when they become wet.  Wear a facemask and gloves Wear a disposable facemask at all times in the room and gloves when you touch or have contact with the patients blood, body fluids, and/or secretions or excretions, such as sweat, saliva, sputum, nasal mucus, vomit, urine, or feces.  Ensure the mask fits over your nose and mouth tightly, and do not touch it during use. Throw out disposable facemasks and gloves after using them. Do not reuse. Wash your hands immediately after removing your facemask and gloves. If your personal clothing becomes contaminated, carefully remove clothing and launder. Wash your hands after handling contaminated clothing. Place all used disposable facemasks, gloves, and other waste in a lined container  before disposing them with other household waste. Remove gloves and wash your hands immediately after handling these items.  Do not share dishes, glasses, or other household items with the patient Avoid sharing household items. You should not share dishes, drinking glasses, cups, eating utensils, towels, bedding, or other items with a patient who is confirmed to have, or being evaluated for, COVID-19 infection. After the person uses these items, you should wash them thoroughly with soap and water.  Wash laundry thoroughly Immediately remove and wash clothes or bedding that have blood, body fluids, and/or secretions or excretions, such as sweat, saliva, sputum, nasal mucus, vomit, urine, or feces, on them. Wear gloves when handling laundry from the patient. Read and follow directions on labels of laundry or clothing items and detergent. In general, wash and dry with the warmest temperatures recommended on the label.  Clean all areas the individual has used often Clean all touchable surfaces, such as counters, tabletops, doorknobs, bathroom fixtures, toilets, phones, keyboards, tablets, and bedside tables, every day. Also, clean any surfaces that may have blood, body fluids, and/or secretions or excretions on them. Wear gloves when cleaning surfaces the patient has come in contact with. Use a diluted bleach solution (e.g., dilute bleach with 1 part bleach and 10 parts water) or a household disinfectant with a label that says EPA-registered for coronaviruses. To make  a bleach solution at home, add 1 tablespoon of bleach to 1 quart (4 cups) of water. For a larger supply, add  cup of bleach to 1 gallon (16 cups) of water. Read labels of cleaning products and follow recommendations provided on product labels. Labels contain instructions for safe and effective use of the cleaning product including precautions you should take when applying the product, such as wearing gloves or eye protection and making  sure you have good ventilation during use of the product. Remove gloves and wash hands immediately after cleaning.  Monitor yourself for signs and symptoms of illness Caregivers and household members are considered close contacts, should monitor their health, and will be asked to limit movement outside of the home to the extent possible. Follow the monitoring steps for close contacts listed on the symptom monitoring form.   ? If you have additional questions, contact your local health department or call the epidemiologist on call at [phone] (available 24/7). ? This guidance is subject to change. For the most up-to-date guidance from [facility], please refer to their website: [email or web address]hu

## 2019-03-05 LAB — CULTURE, BLOOD (ROUTINE X 2)
Culture: NO GROWTH
Culture: NO GROWTH
Special Requests: ADEQUATE
Special Requests: ADEQUATE

## 2021-05-30 ENCOUNTER — Encounter: Payer: Self-pay | Admitting: Internal Medicine

## 2021-06-27 ENCOUNTER — Encounter: Payer: Self-pay | Admitting: Internal Medicine

## 2021-06-30 ENCOUNTER — Other Ambulatory Visit (INDEPENDENT_AMBULATORY_CARE_PROVIDER_SITE_OTHER): Payer: Self-pay

## 2021-06-30 ENCOUNTER — Encounter: Payer: Self-pay | Admitting: Internal Medicine

## 2021-06-30 ENCOUNTER — Ambulatory Visit: Payer: Self-pay | Admitting: Internal Medicine

## 2021-06-30 VITALS — BP 122/82 | HR 68 | Ht 67.0 in | Wt 215.2 lb

## 2021-06-30 DIAGNOSIS — R109 Unspecified abdominal pain: Secondary | ICD-10-CM

## 2021-06-30 DIAGNOSIS — K219 Gastro-esophageal reflux disease without esophagitis: Secondary | ICD-10-CM

## 2021-06-30 DIAGNOSIS — R7989 Other specified abnormal findings of blood chemistry: Secondary | ICD-10-CM

## 2021-06-30 DIAGNOSIS — R198 Other specified symptoms and signs involving the digestive system and abdomen: Secondary | ICD-10-CM

## 2021-06-30 LAB — COMPREHENSIVE METABOLIC PANEL
ALT: 21 U/L (ref 0–53)
AST: 17 U/L (ref 0–37)
Albumin: 4.8 g/dL (ref 3.5–5.2)
Alkaline Phosphatase: 132 U/L — ABNORMAL HIGH (ref 39–117)
BUN: 13 mg/dL (ref 6–23)
CO2: 28 mEq/L (ref 19–32)
Calcium: 9.9 mg/dL (ref 8.4–10.5)
Chloride: 105 mEq/L (ref 96–112)
Creatinine, Ser: 0.88 mg/dL (ref 0.40–1.50)
GFR: 115.28 mL/min (ref >60.00)
Glucose, Bld: 104 mg/dL — ABNORMAL HIGH (ref 70–99)
Potassium: 4 mEq/L (ref 3.5–5.1)
Sodium: 139 mEq/L (ref 135–145)
Total Bilirubin: 0.8 mg/dL (ref 0.2–1.2)
Total Protein: 7.8 g/dL (ref 6.0–8.3)

## 2021-06-30 MED ORDER — OMEPRAZOLE 40 MG PO CPDR: 40.0000 mg | DELAYED_RELEASE_CAPSULE | Freq: Two times a day (BID) | ORAL | 3 refills | Status: DC

## 2021-06-30 NOTE — Patient Instructions (Addendum)
Your provider has requested that you go to the basement level for lab work before leaving today. Press "B" on the elevator. The lab is located at the first door on the left as you exit the elevator.   STOP Famotidine   Start Omeprazole 40 mg twice daily  Please message with any issues after mediation changes   Due to recent changes in healthcare laws, you may see the results of your imaging and laboratory studies on MyChart before your provider has had a chance to review them.  We understand that in some cases there may be results that are confusing or concerning to you. Not all laboratory results come back in the same time frame and the provider may be waiting for multiple results in order to interpret others.  Please give [place] 48 hours in order for your provider to thoroughly review all the results before contacting the office for clarification of your results.    If you are age 30 or older, your body mass index should be between 23-30. Your Body mass index is 33.71 kg/m. If this is out of the aforementioned range listed, please consider follow up with your Primary Care Provider.  If you are age 13 or younger, your body mass index should be between 19-25. Your Body mass index is 33.71 kg/m. If this is out of the aformentioned range listed, please consider follow up with your Primary Care Provider.   __________________________________________________________  The [facility] providers would like to encourage you to use [facility] to communicate with providers for non-urgent requests or questions.  Due to long hold times on the telephone, sending your provider a message by [facility] may be a faster and more efficient way to get a response.  Please allow 48 business hours for a response.  Please remember that this is for non-urgent requests.    I appreciate the  opportunity to care for you  Thank You   Nicole Kindred Dorsey,MD

## 2021-06-30 NOTE — Progress Notes (Signed)
Chief Complaint: Abdominal pain  HPI :  30 year old male with history of GERD and pre-DM presents with left sided abdominal pain  Has been having left sided abdominal pain for the last 5 years. This occurs intermittently. The pain feels sharp when it comes on. When he has a BM, the pain does not get better. The pain comes on whenever he feels like he has bloating and burping. Has some burning in his throat. Denies chest burning or regurgitation. Endorses nausea. Denies vomiting. Denies dysphagia or odynophagia. Endorses 2-3 BMs per day alternating with constipation that has been going on for the last 6 months. Endorses some burning in his anus. Denies rectal pain or rectal bleeding. Denies melena. Denies fam hx of GI cancers or GI issues. He used to be on omeprazole, but he switched to famotidine, which he is taking it once a day. He used to be a heavy alcohol drinker, and he quit 3 months ago. Denies EGD or colonoscopy in the past. Denies prior IVDU. He is not sure if he has ever been exposed to viral hepatitis. Has been told in the past that he has an "inflamed liver"  Wt Readings from Last 3 Encounters:  06/30/21 215 lb 3.2 oz (97.6 kg)  06/22/18 200 lb (90.7 kg)    Past Medical History:  Diagnosis Date   Dyspnea on exertion    Elevated liver enzymes    GERD (gastroesophageal reflux disease)    Hyperglycemia    Hypocalcemia    Ketonuria    Mass of hard palate    Prediabetes    Proteinuria    Right ventricular hypertrophy    Tonsillitis      Past Surgical History:  Procedure Laterality Date   TONSILLECTOMY     Family History  Problem Relation Age of Onset   Diabetes Mother    Heart disease Mother    Colon cancer Neg Hx    Esophageal cancer Neg Hx    Pancreatic cancer Neg Hx    Stomach cancer Neg Hx    Liver disease Neg Hx    Social History   Tobacco Use   Smoking status: Former   Smokeless tobacco: Never  Vaping Use   Vaping Use: Never used  Substance Use Topics    Alcohol use: Not Currently    Comment: d/c about 03/28/2021; previous heavy drinker   Drug use: Not Currently   Current Outpatient Medications  Medication Sig Dispense Refill   metFORMIN (GLUCOPHAGE-XR) 500 MG 24 hr tablet Take 1 tablet by mouth daily.     omeprazole (PRILOSEC) 40 MG capsule Take 1 capsule (40 mg total) by mouth in the morning and at bedtime. 60 capsule 3   No current facility-administered medications for this visit.   No Known Allergies   Review of Systems: All systems reviewed and negative except where noted in HPI.   Physical Exam: BP 122/82   Pulse 68   Ht 5\' 7"  (1.702 m)   Wt 215 lb 3.2 oz (97.6 kg)   BMI 33.71 kg/m  Constitutional: Pleasant,well-developed, male in no acute distress. HEENT: Normocephalic and atraumatic. Conjunctivae are normal. No scleral icterus. Cardiovascular: Normal rate, regular rhythm.  Pulmonary/chest: Effort normal and breath sounds normal. No wheezing, rales or rhonchi. Abdominal: Soft, nondistended, tender in the epigastric and left side of the abdomen. Bowel sounds active throughout. There are no masses palpable. No hepatomegaly. Extremities: No edema Neurological: Alert and oriented to person place and time. Skin: Skin is warm  and dry. No rashes noted. Psychiatric: Normal mood and affect. Behavior is normal.  H pylori breath test 05/27/21 - negative  Labs 04/2021: HbA1C 6%, alk phos 148 (H), AST 21, ALT 32, T bili 0.5, CBC nml.  CT A/P w/contrast 04/2011: IMPRESSION:  No acute traumatic abnormality involving the chest abdomen pelvis.  ASSESSMENT AND PLAN: Left sided abdominal pain GERD Alternating diarrhea and constipation Elevated alkaline phosphatase Patient states that he has been having left-sided abdominal pain for the last 5 years. He had a H pylori breath test that was normal. This abdominal pain appears to partially respond to both PPI and H2 blocker therapy. We will plan to maximize him on PPI therapy to see if  he responds.  If patient does not respond to PPI therapy, then we will plan for EGD at that time. I instructed the patient to let me know if his abdominal pain persists.  Patient also describes some issues with alternating diarrhea and constipation, which are suspicious for IBS.  Patient has a history of heavy alcohol use and only quit recently.  Per the patient and his friend, he has had more significantly elevated LFTs in the past.  Per my review, he was only noted to have an elevated alk phos on his most recent set of labs from 04/2021.  We will plan to recheck his LFTs today and rule out viral hepatitis to start off with. - Check CMP and hepatitis panel - Start omeprazole 40 mg BID - Stop famotidine - Will attempt to get him set up with an [employer] for financial assistance  Christia Reading, MD

## 2021-07-01 LAB — HEPATITIS C ANTIBODY
Hepatitis C Ab: NONREACTIVE
SIGNAL TO CUT-OFF: 0.02 (ref <1.00)

## 2021-07-01 LAB — HEPATITIS B SURFACE ANTIBODY,QUALITATIVE: Hep B S Ab: NONREACTIVE

## 2021-07-01 LAB — HEPATITIS B SURFACE ANTIGEN: Hepatitis B Surface Ag: NONREACTIVE

## 2021-07-01 LAB — HEPATITIS A ANTIBODY, TOTAL: Hepatitis A AB,Total: REACTIVE — AB

## 2021-07-01 NOTE — Progress Notes (Signed)
Hi Riley Zavala, please call or send the patient a letter and let him know that his liver function tests are stable. He is Spanish speaking so you may need the assistance of an interpreter. He has one mildly elevated liver function test, which may be a result of his prior alcohol use. His liver function tests can be monitored to make sure that they are not worsening. His viral hepatitis labs were not concerning. He can come back to clinic in 3 months for follow up to see how he is doing in terms of his abdominal pain

## 2021-07-02 ENCOUNTER — Encounter: Payer: Self-pay | Admitting: Internal Medicine

## 2021-07-07 ENCOUNTER — Emergency Department (HOSPITAL_COMMUNITY)
Admission: EM | Admit: 2021-07-07 | Discharge: 2021-07-07 | Disposition: A | Discharge status: Home / Self Care | Payer: Self-pay | Attending: Student | Admitting: Student

## 2021-07-07 ENCOUNTER — Encounter (HOSPITAL_COMMUNITY): Payer: Self-pay | Admitting: Emergency Medicine

## 2021-07-07 ENCOUNTER — Encounter (HOSPITAL_COMMUNITY): Payer: Self-pay

## 2021-07-07 ENCOUNTER — Other Ambulatory Visit: Payer: Self-pay

## 2021-07-07 ENCOUNTER — Ambulatory Visit (HOSPITAL_COMMUNITY)
Admission: EM | Admit: 2021-07-07 | Discharge: 2021-07-07 | Disposition: A | Discharge status: Home / Self Care | Payer: Self-pay | Attending: Physician Assistant | Admitting: Physician Assistant

## 2021-07-07 DIAGNOSIS — R1032 Left lower quadrant pain: Secondary | ICD-10-CM

## 2021-07-07 DIAGNOSIS — R300 Dysuria: Secondary | ICD-10-CM | POA: Insufficient documentation

## 2021-07-07 DIAGNOSIS — Z5321 Procedure and treatment not carried out due to patient leaving prior to being seen by health care provider: Secondary | ICD-10-CM | POA: Insufficient documentation

## 2021-07-07 LAB — CBC WITH DIFFERENTIAL/PLATELET
Abs Immature Granulocytes: 0.04 10*3/uL (ref 0.00–0.07)
Basophils Absolute: 0 10*3/uL (ref 0.0–0.1)
Basophils Relative: 0 %
Eosinophils Absolute: 0.1 10*3/uL (ref 0.0–0.5)
Eosinophils Relative: 1 %
HCT: 48.1 % (ref 39.0–52.0)
Hemoglobin: 15.7 g/dL (ref 13.0–17.0)
Immature Granulocytes: 0 %
Lymphocytes Relative: 26 %
Lymphs Abs: 2.7 10*3/uL (ref 0.7–4.0)
MCH: 27.5 pg (ref 26.0–34.0)
MCHC: 32.6 g/dL (ref 30.0–36.0)
MCV: 84.2 fL (ref 80.0–100.0)
Monocytes Absolute: 0.7 10*3/uL (ref 0.1–1.0)
Monocytes Relative: 7 %
Neutro Abs: 6.7 10*3/uL (ref 1.7–7.7)
Neutrophils Relative %: 66 %
Platelets: 217 10*3/uL (ref 150–400)
RBC: 5.71 MIL/uL (ref 4.22–5.81)
RDW: 12.9 % (ref 11.5–15.5)
WBC: 10.2 10*3/uL (ref 4.0–10.5)
nRBC: 0 % (ref 0.0–0.2)

## 2021-07-07 LAB — BASIC METABOLIC PANEL
Anion gap: 6 (ref 5–15)
BUN: 17 mg/dL (ref 6–20)
CO2: 26 mmol/L (ref 22–32)
Calcium: 9.5 mg/dL (ref 8.9–10.3)
Chloride: 107 mmol/L (ref 98–111)
Creatinine, Ser: 0.6 mg/dL — ABNORMAL LOW (ref 0.61–1.24)
GFR, Estimated: >60 mL/min (ref >60)
Glucose, Bld: 108 mg/dL — ABNORMAL HIGH (ref 70–99)
Potassium: 3.9 mmol/L (ref 3.5–5.1)
Sodium: 139 mmol/L (ref 135–145)

## 2021-07-07 LAB — POCT URINALYSIS DIPSTICK, ED / UC
Bilirubin Urine: NEGATIVE
Glucose, UA: NEGATIVE mg/dL
Hgb urine dipstick: NEGATIVE
Ketones, ur: NEGATIVE mg/dL
Leukocytes,Ua: NEGATIVE
Nitrite: NEGATIVE
Protein, ur: NEGATIVE mg/dL
Specific Gravity, Urine: >=1.03 (ref 1.005–1.030)
Urobilinogen, UA: 0.2 mg/dL (ref 0.0–1.0)
pH: 5.5 (ref 5.0–8.0)

## 2021-07-07 MED ORDER — ACETAMINOPHEN 325 MG PO TABS
ORAL_TABLET | ORAL | Status: AC
  Filled 2021-07-07: qty 2

## 2021-07-07 MED ORDER — ACETAMINOPHEN 325 MG PO TABS
650.0000 mg | ORAL_TABLET | Freq: Once | ORAL | Status: AC
  Administered 2021-07-07: 650 mg via ORAL

## 2021-07-07 NOTE — Discharge Instructions (Addendum)
Labs are reassuring Call GI specialist in the morning

## 2021-07-07 NOTE — ED Triage Notes (Signed)
Patient c/o left abdominal pain and states it started at 0300 today. Patient denies N/V/D. Patient reports that when he urinates it hurts in his abdomen.

## 2021-07-07 NOTE — ED Triage Notes (Signed)
Pt c/o left abd pains that got worse today. Reports lots of belching and "taste of blood when burps". Reports pain with urination.  Went to [facility] but wait to long left.

## 2021-07-07 NOTE — ED Provider Notes (Signed)
Emergency Medicine Provider Triage Evaluation Note  Riley Zavala , a 30 y.o. male  was evaluated in triage.  Pt complains of left lower quadrant abdominal pain.  States that the pain has been intermittent for "a long period of time now."  However this morning states that the pain returned and has not abated.  He describes it as a sharp pain that at times radiates to the periumbilical area.  Denies nausea, vomiting, diarrhea.  Endorses dysuria and feeling like he has incomplete emptying of his bladder.  Also complains of constipation, which is chronic.  Review of Systems  Positive: Abdominal pain Negative: Fever, melena, hematochezia  Physical Exam  BP (!) 133/94 (BP Location: Right Arm)   Pulse 80   Temp 99.1 F (37.3 C) (Oral)   Resp 18   Ht 5\' 7"  (1.702 m)   Wt 98 kg   SpO2 98%   BMI 33.83 kg/m  Gen:   Awake, no distress   Resp:  Normal effort  MSK:   Moves extremities without difficulty  Other:  Abdomen is soft.+ Bowel sounds in all 4 quadrants.  Tenderness to the right lower quadrant and suprapubic area on light palpation.  Negative Murphy's, negative McBurney's, negative psoas.  No rebound tenderness.  Medical Decision Making  Medically screening exam initiated at 11:39 AM.  Appropriate orders placed.  Majano was informed that the remainder of the evaluation will be completed by another provider, this initial triage assessment does not replace that evaluation, and the importance of remaining in the ED until their evaluation is complete.     Lucky Rathke, PA-C 07/07/21 1141    09/06/21, MD 07/07/21 [phone]

## 2021-07-07 NOTE — ED Provider Notes (Addendum)
[facility]    CSN: [id number] Arrival date & time: 07/07/21  1646      History   Chief Complaint Chief Complaint  Patient presents with   Abdominal Pain    HPI Riley Zavala is a 30 y.o. male.   Pt complains of chronic left lower quadrant pain that has been going on for some time and became worse around 3am this morning.  Denies n/v/d, dysuria, fever, chills, blood in stool.  He was seen by GI 10/3.  Started on PPI for reflux and advised to reach out if abdominal pain became worse.  He reports reflux has improved somewhat since starting PPI. He was in the ED earlier today and left due to high wait time.  Labs there normal.     Past Medical History:  Diagnosis Date   Dyspnea on exertion    Elevated liver enzymes    GERD (gastroesophageal reflux disease)    Hyperglycemia    Hypocalcemia    Ketonuria    Mass of hard palate    Prediabetes    Proteinuria    Right ventricular hypertrophy    Tonsillitis     There are no problems to display for this patient.   Past Surgical History:  Procedure Laterality Date   TONSILLECTOMY         Home Medications    Prior to Admission medications   Medication Sig Start Date End Date Taking? Authorizing Provider  metFORMIN (GLUCOPHAGE-XR) 500 MG 24 hr tablet Take 1 tablet by mouth daily. 05/05/21   [provider]  omeprazole (PRILOSEC) 40 MG capsule Take 1 capsule (40 mg total) by mouth in the morning and at bedtime. 06/30/21   Imogene Burn, MD    Family History Family History  Problem Relation Age of Onset   Diabetes Mother    Heart disease Mother    Colon cancer Neg Hx    Esophageal cancer Neg Hx    Pancreatic cancer Neg Hx    Stomach cancer Neg Hx    Liver disease Neg Hx     Social History Social History   Tobacco Use   Smoking status: Former   Smokeless tobacco: Never  Vaping Use   Vaping Use: Never used  Substance Use Topics   Alcohol use: Not Currently    Comment: d/c  about 03/28/2021; previous heavy drinker   Drug use: Not Currently     Allergies   Patient has no known allergies.   Review of Systems Review of Systems  Constitutional:  Negative for chills and fever.  HENT:  Negative for ear pain and sore throat.   Eyes:  Negative for pain and visual disturbance.  Respiratory:  Negative for cough and shortness of breath.   Cardiovascular:  Negative for chest pain and palpitations.  Gastrointestinal:  Positive for abdominal pain. Negative for anal bleeding, diarrhea, nausea and vomiting.  Genitourinary:  Negative for dysuria and hematuria.  Musculoskeletal:  Negative for arthralgias and back pain.  Skin:  Negative for color change and rash.  Neurological:  Negative for seizures and syncope.  All other systems reviewed and are negative.   Physical Exam Triage Vital Signs ED Triage Vitals  Enc Vitals Group     BP 07/07/21 1828 122/78     Pulse Rate 07/07/21 1828 79     Resp 07/07/21 1828 15     Temp 07/07/21 1828 98.3 F (36.8 C)     Temp Source 07/07/21 1828 Oral  SpO2 07/07/21 1828 97 %     Weight --      Height --      Head Circumference --      Peak Flow --      Pain Score 07/07/21 1827 9     Pain Loc --      Pain Edu? --      Excl. in GC? --    No data found.  Updated Vital Signs BP 122/78 (BP Location: Left Arm)   Pulse 79   Temp 98.3 F (36.8 C) (Oral)   Resp 15   SpO2 97%   Visual Acuity Right Eye Distance:   Left Eye Distance:   Bilateral Distance:    Right Eye Near:   Left Eye Near:    Bilateral Near:     Physical Exam Vitals and nursing note reviewed.  Constitutional:      Appearance: He is well-developed.  HENT:     Head: Normocephalic and atraumatic.  Eyes:     Conjunctiva/sclera: Conjunctivae normal.  Cardiovascular:     Rate and Rhythm: Normal rate and regular rhythm.     Heart sounds: No murmur heard. Pulmonary:     Effort: Pulmonary effort is normal. No respiratory distress.     Breath  sounds: Normal breath sounds.  Abdominal:     Palpations: Abdomen is soft.     Tenderness: There is abdominal tenderness in the left lower quadrant. There is no right CVA tenderness or left CVA tenderness. Negative signs include Murphy's sign and McBurney's sign.  Musculoskeletal:     Cervical back: Neck supple.  Skin:    General: Skin is warm and dry.  Neurological:     Mental Status: He is alert.     UC Treatments / Results  Labs (all labs ordered are listed, but only abnormal results are displayed) Labs Reviewed  POCT URINALYSIS DIPSTICK, ED / UC    EKG   Radiology No results found.  Procedures Procedures (including critical care time)  Medications Ordered in UC Medications  acetaminophen (TYLENOL) tablet 650 mg (has no administration in time range)    Initial Impression / Assessment and Plan / UC Course  I have reviewed the triage vital signs and the nursing notes.  Pertinent labs & imaging results that were available during my care of the patient were reviewed by me and considered in my medical decision making (see chart for details).     At this time labs reassuring, pt denies blood in stool, n/v, fevers.  LLQ is chronic.  Advised follow up with GI.   Final Clinical Impressions(s) / UC Diagnoses   Final diagnoses:  Abdominal pain, left lower quadrant     Discharge Instructions      Labs are reassuring Call GI specialist in the morning      ED Prescriptions   None    PDMP not reviewed this encounter.   Zavala, Riley Fantasia, PA-C 07/07/21 1901    Zavala, Riley Fantasia, PA-C 07/16/21 [phone]

## 2021-07-09 ENCOUNTER — Emergency Department (HOSPITAL_COMMUNITY)
Admission: EM | Admit: 2021-07-09 | Discharge: 2021-07-09 | Disposition: A | Discharge status: Home / Self Care | Payer: Self-pay | Attending: Emergency Medicine | Admitting: Emergency Medicine

## 2021-07-09 ENCOUNTER — Emergency Department (HOSPITAL_COMMUNITY): Payer: Self-pay

## 2021-07-09 ENCOUNTER — Encounter (HOSPITAL_COMMUNITY): Payer: Self-pay | Admitting: Emergency Medicine

## 2021-07-09 DIAGNOSIS — Z87891 Personal history of nicotine dependence: Secondary | ICD-10-CM | POA: Insufficient documentation

## 2021-07-09 DIAGNOSIS — Z7984 Long term (current) use of oral hypoglycemic drugs: Secondary | ICD-10-CM | POA: Insufficient documentation

## 2021-07-09 DIAGNOSIS — K6389 Other specified diseases of intestine: Secondary | ICD-10-CM

## 2021-07-09 DIAGNOSIS — K219 Gastro-esophageal reflux disease without esophagitis: Secondary | ICD-10-CM | POA: Insufficient documentation

## 2021-07-09 DIAGNOSIS — E119 Type 2 diabetes mellitus without complications: Secondary | ICD-10-CM | POA: Insufficient documentation

## 2021-07-09 DIAGNOSIS — K659 Peritonitis, unspecified: Secondary | ICD-10-CM | POA: Insufficient documentation

## 2021-07-09 LAB — COMPREHENSIVE METABOLIC PANEL
ALT: 23 U/L (ref 0–44)
AST: 20 U/L (ref 15–41)
Albumin: 4.1 g/dL (ref 3.5–5.0)
Alkaline Phosphatase: 136 U/L — ABNORMAL HIGH (ref 38–126)
Anion gap: 8 (ref 5–15)
BUN: 17 mg/dL (ref 6–20)
CO2: 24 mmol/L (ref 22–32)
Calcium: 9.2 mg/dL (ref 8.9–10.3)
Chloride: 105 mmol/L (ref 98–111)
Creatinine, Ser: 0.93 mg/dL (ref 0.61–1.24)
GFR, Estimated: >60 mL/min (ref >60)
Glucose, Bld: 122 mg/dL — ABNORMAL HIGH (ref 70–99)
Potassium: 3.4 mmol/L — ABNORMAL LOW (ref 3.5–5.1)
Sodium: 137 mmol/L (ref 135–145)
Total Bilirubin: 0.7 mg/dL (ref 0.3–1.2)
Total Protein: 7.3 g/dL (ref 6.5–8.1)

## 2021-07-09 LAB — CBC
HCT: 47.6 % (ref 39.0–52.0)
Hemoglobin: 15.5 g/dL (ref 13.0–17.0)
MCH: 27.5 pg (ref 26.0–34.0)
MCHC: 32.6 g/dL (ref 30.0–36.0)
MCV: 84.5 fL (ref 80.0–100.0)
Platelets: 211 10*3/uL (ref 150–400)
RBC: 5.63 MIL/uL (ref 4.22–5.81)
RDW: 12.9 % (ref 11.5–15.5)
WBC: 10.6 10*3/uL — ABNORMAL HIGH (ref 4.0–10.5)
nRBC: 0 % (ref 0.0–0.2)

## 2021-07-09 LAB — URINALYSIS, ROUTINE W REFLEX MICROSCOPIC
Bilirubin Urine: NEGATIVE
Glucose, UA: NEGATIVE mg/dL
Hgb urine dipstick: NEGATIVE
Ketones, ur: NEGATIVE mg/dL
Leukocytes,Ua: NEGATIVE
Nitrite: NEGATIVE
Protein, ur: NEGATIVE mg/dL
Specific Gravity, Urine: 1.03 (ref 1.005–1.030)
pH: 5 (ref 5.0–8.0)

## 2021-07-09 LAB — LIPASE, BLOOD: Lipase: 22 U/L (ref 11–51)

## 2021-07-09 MED ORDER — MORPHINE SULFATE (PF) 4 MG/ML IV SOLN
4.0000 mg | Freq: Once | INTRAVENOUS | Status: AC
  Administered 2021-07-09: 4 mg via INTRAVENOUS
  Filled 2021-07-09: qty 1

## 2021-07-09 MED ORDER — IOHEXOL 350 MG/ML SOLN
80.0000 mL | Freq: Once | INTRAVENOUS | Status: AC | PRN
  Administered 2021-07-09: 80 mL via INTRAVENOUS

## 2021-07-09 MED ORDER — ONDANSETRON HCL 4 MG/2ML IJ SOLN
4.0000 mg | Freq: Once | INTRAMUSCULAR | Status: AC
  Administered 2021-07-09: 4 mg via INTRAVENOUS
  Filled 2021-07-09: qty 2

## 2021-07-09 MED ORDER — SODIUM CHLORIDE 0.9 % IV BOLUS
1000.0000 mL | Freq: Once | INTRAVENOUS | Status: AC
  Administered 2021-07-09: 1000 mL via INTRAVENOUS

## 2021-07-09 MED ORDER — HYDROMORPHONE HCL 1 MG/ML IJ SOLN
1.0000 mg | Freq: Once | INTRAMUSCULAR | Status: AC
  Administered 2021-07-09: 1 mg via INTRAVENOUS
  Filled 2021-07-09: qty 1

## 2021-07-09 MED ORDER — HYDROCODONE-ACETAMINOPHEN 5-325 MG PO TABS: 1.0000 | ORAL_TABLET | ORAL | 0 refills | Status: DC | PRN

## 2021-07-09 MED ORDER — IBUPROFEN 600 MG PO TABS: 600.0000 mg | ORAL_TABLET | Freq: Four times a day (QID) | ORAL | 0 refills | Status: DC | PRN

## 2021-07-09 NOTE — ED Triage Notes (Signed)
Pt endorses left sided abd pain that is constant for 3 days, worse on lower side. Denies n/v/d. Some discomfort when using bathroom and worse with movement.

## 2021-07-09 NOTE — ED Provider Notes (Signed)
[facility] EMERGENCY DEPARTMENT Provider Note   CSN: [id number] Arrival date & time: 07/09/21  0725     History Chief Complaint  Patient presents with   Abdominal Pain    Riley Zavala is a 30 y.o. male.  Pt presents to the ED today with abdominal pain for 3 days.  Pt said it is in the LLQ and started this am around 0300.  Pt denies n/v or fever.        Past Medical History:  Diagnosis Date   Dyspnea on exertion    Elevated liver enzymes    GERD (gastroesophageal reflux disease)    Hyperglycemia    Hypocalcemia    Ketonuria    Mass of hard palate    Prediabetes    Proteinuria    Right ventricular hypertrophy    Tonsillitis     There are no problems to display for this patient.   Past Surgical History:  Procedure Laterality Date   TONSILLECTOMY         Family History  Problem Relation Age of Onset   Diabetes Mother    Heart disease Mother    Colon cancer Neg Hx    Esophageal cancer Neg Hx    Pancreatic cancer Neg Hx    Stomach cancer Neg Hx    Liver disease Neg Hx     Social History   Tobacco Use   Smoking status: Former   Smokeless tobacco: Never  Vaping Use   Vaping Use: Never used  Substance Use Topics   Alcohol use: Not Currently    Comment: d/c about 03/28/2021; previous heavy drinker   Drug use: Not Currently    Home Medications Prior to Admission medications   Medication Sig Start Date End Date Taking? Authorizing Provider  HYDROcodone-acetaminophen (NORCO/VICODIN) 5-325 MG tablet Take 1 tablet by mouth every 4 (four) hours as needed. 07/09/21  Yes Jacalyn Lefevre, MD  ibuprofen (ADVIL) 600 MG tablet Take 1 tablet (600 mg total) by mouth every 6 (six) hours as needed. 07/09/21  Yes Jacalyn Lefevre, MD  metFORMIN (GLUCOPHAGE-XR) 500 MG 24 hr tablet Take 1 tablet by mouth daily. 05/05/21   [provider]  omeprazole (PRILOSEC) 40 MG capsule Take 1 capsule (40 mg total) by mouth in the morning and at  bedtime. 06/30/21   Imogene Burn, MD    Allergies    Patient has no known allergies.  Review of Systems   Review of Systems  Gastrointestinal:  Positive for abdominal pain.  All other systems reviewed and are negative.  Physical Exam Updated Vital Signs BP 122/83 (BP Location: Right Arm)   Pulse 83   Temp 98.5 F (36.9 C) (Oral)   Resp 13   Ht 5\' 7"  (1.702 m)   Wt 98 kg   SpO2 100%   BMI 33.83 kg/m   Physical Exam Vitals and nursing note reviewed.  Constitutional:      Appearance: He is well-developed.  HENT:     Head: Normocephalic and atraumatic.     Mouth/Throat:     Mouth: Mucous membranes are moist.     Pharynx: Oropharynx is clear.  Eyes:     Extraocular Movements: Extraocular movements intact.     Pupils: Pupils are equal, round, and reactive to light.  Cardiovascular:     Rate and Rhythm: Normal rate and regular rhythm.  Pulmonary:     Effort: Pulmonary effort is normal.     Breath sounds: Normal breath sounds.  Abdominal:     General: Abdomen is flat. Bowel sounds are normal.     Palpations: Abdomen is soft.     Tenderness: There is abdominal tenderness in the left lower quadrant.  Skin:    General: Skin is warm.     Capillary Refill: Capillary refill takes less than 2 seconds.  Neurological:     General: No focal deficit present.     Mental Status: He is alert and oriented to person, place, and time.  Psychiatric:        Mood and Affect: Mood normal.        Behavior: Behavior normal.    ED Results / Procedures / Treatments   Labs (all labs ordered are listed, but only abnormal results are displayed) Labs Reviewed  COMPREHENSIVE METABOLIC PANEL - Abnormal; Notable for the following components:      Result Value   Potassium 3.4 (*)    Glucose, Bld 122 (*)    Alkaline Phosphatase 136 (*)    All other components within normal limits  CBC - Abnormal; Notable for the following components:   WBC 10.6 (*)    All other components within normal  limits  LIPASE, BLOOD  URINALYSIS, ROUTINE W REFLEX MICROSCOPIC    EKG None  Radiology CT ABDOMEN PELVIS W CONTRAST  Result Date: 07/09/2021 CLINICAL DATA:  Diverticulitis suspected.  Abdominal pain. EXAM: CT ABDOMEN AND PELVIS WITH CONTRAST TECHNIQUE: Multidetector CT imaging of the abdomen and pelvis was performed using the standard protocol following bolus administration of intravenous contrast. CONTRAST:  42mL OMNIPAQUE IOHEXOL 350 MG/ML SOLN COMPARISON:  CT abdomen pelvis 05/25/2011 FINDINGS: Lower chest: No acute abnormality. Hepatobiliary: No focal liver abnormality. No gallstones, gallbladder wall thickening, or pericholecystic fluid. No biliary dilatation. Pancreas: No focal lesion. Normal pancreatic contour. No surrounding inflammatory changes. No main pancreatic ductal dilatation. Spleen: Normal in size without focal abnormality. Adrenals/Urinary Tract: No adrenal nodule bilaterally. Bilateral kidneys enhance symmetrically. No hydronephrosis. No hydroureter. The urinary bladder is unremarkable. Stomach/Bowel: Stomach is within normal limits. No evidence of bowel wall thickening or dilatation. Pericolonic fat stranding along the proximal sigmoid colon with associated centrally a fat density ovoid structure measuring approximately 2.4 cm. Appendix appears normal. Vascular/Lymphatic: No abdominal aorta or iliac aneurysm. No abdominal, pelvic, or inguinal lymphadenopathy. Reproductive: Prostate is unremarkable. Other: Trace free fluid within the left lower abdomen. No intraperitoneal free gas. No organized fluid collection. Musculoskeletal: No abdominal wall hernia or abnormality. No suspicious lytic or blastic osseous lesions. No acute displaced fracture. IMPRESSION: Epiploic appendagitis along the proximal sigmoid colon. Electronically Signed   By: Tish Frederickson M.D.   On: 07/09/2021 16:20    Procedures Procedures   Medications Ordered in ED Medications  HYDROmorphone (DILAUDID)  injection 1 mg (has no administration in time range)  sodium chloride 0.9 % bolus 1,000 mL (0 mLs Intravenous Stopped 07/09/21 1558)  morphine 4 MG/ML injection 4 mg (4 mg Intravenous Given 07/09/21 1251)  ondansetron (ZOFRAN) injection 4 mg (4 mg Intravenous Given 07/09/21 1250)  iohexol (OMNIPAQUE) 350 MG/ML injection 80 mL (80 mLs Intravenous Contrast Given 07/09/21 1555)    ED Course  I have reviewed the triage vital signs and the nursing notes.  Pertinent labs & imaging results that were available during my care of the patient were reviewed by me and considered in my medical decision making (see chart for details).    MDM Rules/Calculators/A&P  Pt's ct scan reviewed.  Treatment is pain control.  Pt is stable for d/c.  Return if worse.   Due to language barrier, an interpreter was present during the history-taking and subsequent discussion (and for part of the physical exam) with this patient.  Final Clinical Impression(s) / ED Diagnoses Final diagnoses:  Epiploic appendagitis    Rx / DC Orders ED Discharge Orders          Ordered    HYDROcodone-acetaminophen (NORCO/VICODIN) 5-325 MG tablet  Every 4 hours PRN        07/09/21 1636    ibuprofen (ADVIL) 600 MG tablet  Every 6 hours PRN        07/09/21 1636             Jacalyn Lefevre, MD 07/09/21 1637

## 2021-09-24 NOTE — Progress Notes (Deleted)
Chief Complaint: Abdominal pain  HPI :  30 year old male with history of GERD and pre-DM presents with left sided abdominal pain  Has been having left sided abdominal pain for the last 5 years. This occurs intermittently. The pain feels sharp when it comes on. When he has a BM, the pain does not get better. The pain comes on whenever he feels like he has bloating and burping. Has some burning in his throat. Denies chest burning or regurgitation. Endorses nausea. Denies vomiting. Denies dysphagia or odynophagia. Endorses 2-3 BMs per day alternating with constipation that has been going on for the last 6 months. Endorses some burning in his anus. Denies rectal pain or rectal bleeding. Denies melena. Denies fam hx of GI cancers or GI issues. He used to be on omeprazole, but he switched to famotidine, which he is taking it once a day. He used to be a heavy alcohol drinker, and he quit 3 months ago. Denies EGD or colonoscopy in the past. Denies prior IVDU. He is not sure if he has ever been exposed to viral hepatitis. Has been told in the past that he has an "inflamed liver"  Interval History: ***  Current Outpatient Medications  Medication Sig Dispense Refill   HYDROcodone-acetaminophen (NORCO/VICODIN) 5-325 MG tablet Take 1 tablet by mouth every 4 (four) hours as needed. 10 tablet 0   ibuprofen (ADVIL) 600 MG tablet Take 1 tablet (600 mg total) by mouth every 6 (six) hours as needed. 30 tablet 0   metFORMIN (GLUCOPHAGE-XR) 500 MG 24 hr tablet Take 1 tablet by mouth daily.     omeprazole (PRILOSEC) 40 MG capsule Take 1 capsule (40 mg total) by mouth in the morning and at bedtime. 60 capsule 3   No current facility-administered medications for this visit.   Review of Systems: All systems reviewed and negative except where noted in HPI.   Physical Exam: There were no vitals taken for this visit. Constitutional: Pleasant,well-developed, male in no acute distress. HEENT: Normocephalic and  atraumatic. Conjunctivae are normal. No scleral icterus. Cardiovascular: Normal rate, regular rhythm.  Pulmonary/chest: Effort normal and breath sounds normal. No wheezing, rales or rhonchi. Abdominal: Soft, nondistended, tender in the epigastric and left side of the abdomen. Bowel sounds active throughout. There are no masses palpable. No hepatomegaly. Extremities: No edema Neurological: Alert and oriented to person place and time. Skin: Skin is warm and dry. No rashes noted. Psychiatric: Normal mood and affect. Behavior is normal.  H pylori breath test 05/27/21 - negative  Labs 04/2021: HbA1C 6%, alk phos 148 (H), AST 21, ALT 32, T bili 0.5, CBC nml.  CT A/P w/contrast 04/2011: IMPRESSION:  No acute traumatic abnormality involving the chest abdomen pelvis.  ASSESSMENT AND PLAN: Left sided abdominal pain GERD Alternating diarrhea and constipation Elevated alkaline phosphatase Patient states that he has been having left-sided abdominal pain for the last 5 years. He had a H pylori breath test that was normal. This abdominal pain appears to partially respond to both PPI and H2 blocker therapy. We will plan to maximize him on PPI therapy to see if he responds.  If patient does not respond to PPI therapy, then we will plan for EGD at that time. I instructed the patient to let me know if his abdominal pain persists.  Patient also describes some issues with alternating diarrhea and constipation, which are suspicious for IBS.  Patient has a history of heavy alcohol use and only quit recently.  Per the patient  and his friend, he has had more significantly elevated LFTs in the past.  Per my review, he was only noted to have an elevated alk phos on his most recent set of labs from 04/2021.  We will plan to recheck his LFTs today and rule out viral hepatitis to start off with. - Check CMP and hepatitis panel - Start omeprazole 40 mg BID - Stop famotidine - Will attempt to get him set up with an [employer] for financial assistance  Christia Reading, MD

## 2021-09-25 ENCOUNTER — Ambulatory Visit: Payer: Self-pay | Admitting: Internal Medicine

## 2022-01-06 ENCOUNTER — Other Ambulatory Visit: Payer: Self-pay

## 2022-01-06 MED ORDER — OMEPRAZOLE 40 MG PO CPDR: 40.0000 mg | DELAYED_RELEASE_CAPSULE | Freq: Two times a day (BID) | ORAL | 0 refills | Status: DC

## 2022-01-07 ENCOUNTER — Other Ambulatory Visit: Payer: Self-pay

## 2022-01-07 MED ORDER — OMEPRAZOLE 40 MG PO CPDR: 40.0000 mg | DELAYED_RELEASE_CAPSULE | Freq: Two times a day (BID) | ORAL | 1 refills | Status: AC

## 2022-02-19 ENCOUNTER — Encounter (HOSPITAL_COMMUNITY): Payer: Self-pay

## 2022-02-19 ENCOUNTER — Ambulatory Visit (HOSPITAL_COMMUNITY)
Admission: RE | Admit: 2022-02-19 | Discharge: 2022-02-19 | Disposition: A | Discharge status: Home / Self Care | Payer: Self-pay | Source: Ambulatory Visit

## 2022-02-19 VITALS — BP 125/93 | HR 69 | Temp 98.0°F | Resp 17

## 2022-02-19 DIAGNOSIS — J3489 Other specified disorders of nose and nasal sinuses: Secondary | ICD-10-CM

## 2022-02-19 NOTE — Discharge Instructions (Signed)
Your symptoms are most likely due to nasal dryness.  Advise saline nasal spray and humidifier.  Please follow-up if symptoms persist or worsen.

## 2022-02-19 NOTE — ED Triage Notes (Signed)
Pt reports nasal irritation and burning sensation in nose x 1.5 weeks. Denies any other symptoms. States have tried flonase with no relief.

## 2022-02-19 NOTE — ED Provider Notes (Addendum)
[place]    CSN: [id number] Arrival date & time: 02/19/22  1037      History   Chief Complaint Chief Complaint  Patient presents with   Nasal Congestion    Entered by patient   Nasal irritation     HPI Riley Zavala is a 31 y.o. male.   Patient presents with a sensation of nasal irritation and burning that has been present for approximately 1.5 weeks.  Denies any associated nasal congestion, runny nose, cough, fever, headache, sinus pressure.  Denies any trauma or foreign body to the nose.  Patient has taken Flonase with no improvement in symptoms.  Patient does report some nasal bleeding that occurs only when blowing nose. Patient does not take blood thinners.     Past Medical History:  Diagnosis Date   Dyspnea on exertion    Elevated liver enzymes    GERD (gastroesophageal reflux disease)    Hyperglycemia    Hypocalcemia    Ketonuria    Mass of hard palate    Prediabetes    Proteinuria    Right ventricular hypertrophy    Tonsillitis     There are no problems to display for this patient.   Past Surgical History:  Procedure Laterality Date   TONSILLECTOMY         Home Medications    Prior to Admission medications   Medication Sig Start Date End Date Taking? Authorizing Provider  HYDROcodone-acetaminophen (NORCO/VICODIN) 5-325 MG tablet Take 1 tablet by mouth every 4 (four) hours as needed. 07/09/21   Isla Pence, MD  ibuprofen (ADVIL) 600 MG tablet Take 1 tablet (600 mg total) by mouth every 6 (six) hours as needed. 07/09/21   Isla Pence, MD  metFORMIN (GLUCOPHAGE-XR) 500 MG 24 hr tablet Take 1 tablet by mouth daily. 05/05/21   [provider]  omeprazole (PRILOSEC) 40 MG capsule Take 1 capsule (40 mg total) by mouth in the morning and at bedtime. 01/07/22   Sharyn Creamer, MD    Family History Family History  Problem Relation Age of Onset   Diabetes Mother    Heart disease Mother    Colon cancer Neg Hx     Esophageal cancer Neg Hx    Pancreatic cancer Neg Hx    Stomach cancer Neg Hx    Liver disease Neg Hx     Social History Social History   Tobacco Use   Smoking status: Former   Smokeless tobacco: Never  Vaping Use   Vaping Use: Never used  Substance Use Topics   Alcohol use: Not Currently    Comment: d/c about 03/28/2021; previous heavy drinker   Drug use: Not Currently     Allergies   Patient has no known allergies.   Review of Systems Review of Systems Per HPI  Physical Exam Triage Vital Signs ED Triage Vitals [02/19/22 1120]  Enc Vitals Group     BP (!) 125/93     Pulse Rate 69     Resp 17     Temp 98 F (36.7 C)     Temp Source Oral     SpO2 100 %     Weight      Height      Head Circumference      Peak Flow      Pain Score 7     Pain Loc      Pain Edu?      Excl. in [place]?    No data found.  Updated Vital Signs BP (!) 125/93 (BP Location: Right Arm)   Pulse 69   Temp 98 F (36.7 C) (Oral)   Resp 17   SpO2 100%   Visual Acuity Right Eye Distance:   Left Eye Distance:   Bilateral Distance:    Right Eye Near:   Left Eye Near:    Bilateral Near:     Physical Exam Constitutional:      General: He is not in acute distress.    Appearance: Normal appearance. He is not toxic-appearing or diaphoretic.  HENT:     Head: Normocephalic and atraumatic.     Nose: No nasal deformity, septal deviation, signs of injury, laceration, nasal tenderness, congestion or rhinorrhea.  Eyes:     Extraocular Movements: Extraocular movements intact.     Conjunctiva/sclera: Conjunctivae normal.  Pulmonary:     Effort: Pulmonary effort is normal.  Neurological:     General: No focal deficit present.     Mental Status: He is alert and oriented to person, place, and time. Mental status is at baseline.  Psychiatric:        Mood and Affect: Mood normal.        Behavior: Behavior normal.        Thought Content: Thought content normal.        Judgment: Judgment normal.      UC Treatments / Results  Labs (all labs ordered are listed, but only abnormal results are displayed) Labs Reviewed - No data to display  EKG   Radiology No results found.  Procedures Procedures (including critical care time)  Medications Ordered in UC Medications - No data to display  Initial Impression / Assessment and Plan / UC Course  I have reviewed the triage vital signs and the nursing notes.  Pertinent labs & imaging results that were available during my care of the patient were reviewed by me and considered in my medical decision making (see chart for details).     There is no obvious abnormality to nasal cavities.  Suspect nasal dryness.  Advised saline nasal sprays and humidifiers. Patient encouraged to follow-up if symptoms persist or worsen.  Patient verbalized understanding and was agreeable with plan.  Patient wished to use family member at bedside for interpretation. Final Clinical Impressions(s) / UC Diagnoses   Final diagnoses:  Nasal dryness     Discharge Instructions      Your symptoms are most likely due to nasal dryness.  Advise saline nasal spray and humidifier.  Please follow-up if symptoms persist or worsen.    ED Prescriptions   None    PDMP not reviewed this encounter.   Teodora Medici, [place] 02/19/22 [place], [place], [place] 02/19/22 [phone]

## 2023-01-24 ENCOUNTER — Ambulatory Visit (HOSPITAL_COMMUNITY)
Admission: EM | Admit: 2023-01-24 | Discharge: 2023-01-24 | Disposition: A | Discharge status: Home / Self Care | Payer: Self-pay | Attending: Emergency Medicine | Admitting: Emergency Medicine

## 2023-01-24 ENCOUNTER — Encounter (HOSPITAL_COMMUNITY): Payer: Self-pay

## 2023-01-24 DIAGNOSIS — J069 Acute upper respiratory infection, unspecified: Secondary | ICD-10-CM

## 2023-01-24 DIAGNOSIS — R062 Wheezing: Secondary | ICD-10-CM

## 2023-01-24 MED ORDER — BENZONATATE 100 MG PO CAPS: 100.0000 mg | ORAL_CAPSULE | Freq: Three times a day (TID) | ORAL | 0 refills | Status: DC

## 2023-01-24 MED ORDER — PREDNISONE 20 MG PO TABS: 40.0000 mg | ORAL_TABLET | Freq: Every day | ORAL | 0 refills | Status: DC

## 2023-01-24 MED ORDER — PROMETHAZINE-DM 6.25-15 MG/5ML PO SYRP: 5.0000 mL | ORAL_SOLUTION | Freq: Every evening | ORAL | 0 refills | Status: DC | PRN

## 2023-01-24 NOTE — Discharge Instructions (Addendum)
Lo ms probable es que sus sntomas actuales sean causados por un virus y deberan mejorar constantemente con el tiempo; pueden pasar de 7 a 10 das antes de que realmente comience a ver un cambio; sin embargo, las Engineer, technical sales.  Para sus sibilancias, comience con prednisona todas las maanas con alimentos durante 5 das para abrir y International aid/development worker las vas respiratorias.  Puede usar la Recruitment consultant cada 8 horas segn sea necesario para ayudar a calmar la tos.  Puede usar jarabe para la tos antes de acostarse para mayor comodidad.  Puede tomar Tylenol y/o ibuprofeno segn sea necesario para reducir la fiebre y Engineer, materials.  Para la tos: 1/2 a 1 cucharadita de miel (puedes diluir la miel en agua u otro lquido). Tambin puedes usar guaifenesina y dextrometorfano para la tos. Puede utilizar un humidificador para la congestin del pecho y la tos. Si no tienes un humidificador, puedes sentarte en el bao con la ducha de agua caliente abierta.  Para el dolor de garganta: pruebe con grgaras de agua tibia con sal, pastillas de cepacol, aerosol para la garganta, t o agua tibia con limn/miel, paletas heladas o hielo, o medicamentos de venta libre para aliviar el resfriado para Environmental health practitioner de Advertising copywriter.  Para la congestin: tome un antihistamnico diario como Zyrtec, Claritin y un descongestionante oral, como pseudoefedrina. Tambin puede utilizar Flonase 1-2 pulverizaciones en cada fosa nasal al da.  Es importante mantenerse hidratado: beba muchos lquidos (agua, gatorade/powerade/pedialyte, jugos o ts) para Photographer garganta hidratada y ayudar a Paramedic an ms la irritacin o Environmental health practitioner.   Your symptoms today are most likely being caused by a virus and should steadily improve in time it can take up to 7 to 10 days before you truly start to see a turnaround however things will get better  For your wheezing begin prednisone every morning with food for 5 days to open and relax the  airway  May use Tessalon pill every 8 hours as needed to help calm your coughing  May use cough syrup at bedtime for additional comfort    You can take Tylenol and/or Ibuprofen as needed for fever reduction and pain relief.   For cough: honey 1/2 to 1 teaspoon (you can dilute the honey in water or another fluid).  You can also use guaifenesin and dextromethorphan for cough. You can use a humidifier for chest congestion and cough.  If you don't have a humidifier, you can sit in the bathroom with the hot shower running.      For sore throat: try warm salt water gargles, cepacol lozenges, throat spray, warm tea or water with lemon/honey, popsicles or ice, or OTC cold relief medicine for throat discomfort.   For congestion: take a daily anti-histamine like Zyrtec, Claritin, and a oral decongestant, such as pseudoephedrine.  You can also use Flonase 1-2 sprays in each nostril daily.   It is important to stay hydrated: drink plenty of fluids (water, gatorade/powerade/pedialyte, juices, or teas) to keep your throat moisturized and help further relieve irritation/discomfort.

## 2023-01-24 NOTE — ED Provider Notes (Signed)
[facility]    CSN: [id number] Arrival date & time: 01/24/23  1136      History   Chief Complaint Chief Complaint  Patient presents with   Cough   Headache    HPI Riley Zavala is a 32 y.o. male.   Patient presents for evaluation of subjective fever, chills, nasal congestion, rhinorrhea, sore throat, bilateral ear pain, cough, wheezing and headaches present for 5 days.  Known sick contact prior to illness.  Has attempted use of Tylenol, NyQuil and DayQuil which have been ineffective.  Former smoker.  Denies respiratory history.  Denies shortness of breath.    Past Medical History:  Diagnosis Date   Dyspnea on exertion    Elevated liver enzymes    GERD (gastroesophageal reflux disease)    Hyperglycemia    Hypocalcemia    Ketonuria    Mass of hard palate    Prediabetes    Proteinuria    Right ventricular hypertrophy    Tonsillitis     There are no problems to display for this patient.   Past Surgical History:  Procedure Laterality Date   TONSILLECTOMY         Home Medications    Prior to Admission medications   Medication Sig Start Date End Date Taking? Authorizing Provider  HYDROcodone-acetaminophen (NORCO/VICODIN) 5-325 MG tablet Take 1 tablet by mouth every 4 (four) hours as needed. 07/09/21   Jacalyn Lefevre, MD  ibuprofen (ADVIL) 600 MG tablet Take 1 tablet (600 mg total) by mouth every 6 (six) hours as needed. 07/09/21   Jacalyn Lefevre, MD  metFORMIN (GLUCOPHAGE-XR) 500 MG 24 hr tablet Take 1 tablet by mouth daily. 05/05/21   [provider]  omeprazole (PRILOSEC) 40 MG capsule Take 1 capsule (40 mg total) by mouth in the morning and at bedtime. 01/07/22   Imogene Burn, MD    Family History Family History  Problem Relation Age of Onset   Diabetes Mother    Heart disease Mother    Colon cancer Neg Hx    Esophageal cancer Neg Hx    Pancreatic cancer Neg Hx    Stomach cancer Neg Hx    Liver disease Neg Hx      Social History Social History   Tobacco Use   Smoking status: Former   Smokeless tobacco: Never  Vaping Use   Vaping Use: Never used  Substance Use Topics   Alcohol use: Not Currently    Comment: d/c about 03/28/2021; previous heavy drinker   Drug use: Not Currently     Allergies   Patient has no known allergies.   Review of Systems Review of Systems  Constitutional:  Positive for chills and fever. Negative for activity change, appetite change, diaphoresis, fatigue and unexpected weight change.  HENT:  Positive for congestion, ear pain, rhinorrhea and sore throat. Negative for dental problem, drooling, ear discharge, facial swelling, hearing loss, mouth sores, nosebleeds, postnasal drip, sinus pressure, sinus pain, sneezing, tinnitus, trouble swallowing and voice change.   Respiratory:  Positive for cough and wheezing. Negative for apnea, choking, chest tightness, shortness of breath and stridor.   Cardiovascular: Negative.   Gastrointestinal: Negative.   Neurological:  Positive for headaches. Negative for dizziness, tremors, seizures, syncope, facial asymmetry, speech difficulty, weakness, light-headedness and numbness.     Physical Exam Triage Vital Signs ED Triage Vitals  Enc Vitals Group     BP 01/24/23 1216 (!) 132/92     Pulse Rate 01/24/23 1216 75  Resp --      Temp 01/24/23 1216 99.2 F (37.3 C)     Temp Source 01/24/23 1216 Oral     SpO2 01/24/23 1216 96 %     Weight --      Height --      Head Circumference --      Peak Flow --      Pain Score 01/24/23 1215 7     Pain Loc --      Pain Edu? --      Excl. in [place]? --    No data found.  Updated Vital Signs BP (!) 132/92 (BP Location: Left Arm)   Pulse 75   Temp 99.2 F (37.3 C) (Oral)   SpO2 96%   Visual Acuity Right Eye Distance:   Left Eye Distance:   Bilateral Distance:    Right Eye Near:   Left Eye Near:    Bilateral Near:     Physical Exam Constitutional:      Appearance: Normal  appearance.  HENT:     Head: Normocephalic.     Right Ear: Tympanic membrane, ear canal and external ear normal.     Left Ear: Tympanic membrane, ear canal and external ear normal.     Nose: Congestion present. No rhinorrhea.     Mouth/Throat:     Mouth: Mucous membranes are moist.     Pharynx: Posterior oropharyngeal erythema present.  Eyes:     Extraocular Movements: Extraocular movements intact.  Cardiovascular:     Rate and Rhythm: Normal rate and regular rhythm.     Pulses: Normal pulses.     Heart sounds: Normal heart sounds.  Pulmonary:     Effort: Pulmonary effort is normal.     Breath sounds: Wheezing present.  Skin:    General: Skin is warm and dry.  Neurological:     Mental Status: He is alert and oriented to person, place, and time. Mental status is at baseline.      UC Treatments / Results  Labs (all labs ordered are listed, but only abnormal results are displayed) Labs Reviewed - No data to display  EKG   Radiology No results found.  Procedures Procedures (including critical care time)  Medications Ordered in UC Medications - No data to display  Initial Impression / Assessment and Plan / UC Course  I have reviewed the triage vital signs and the nursing notes.  Pertinent labs & imaging results that were available during my care of the patient were reviewed by me and considered in my medical decision making (see chart for details).  Viral URI with cough, wheezing  Patient is in no signs of distress nor toxic appearing.  Vital signs are stable.  Low suspicion for pneumonia, pneumothorax or bronchitis and therefore will defer imaging.  Wheezing heard to auscultation, O2 saturation 96% on room air,.,  Stable for outpatient management.  Prescribed prednisone, Tessalon and Promethazine DM. May use additional over-the-counter medications as needed for supportive care.  May follow-up with urgent care as needed if symptoms persist or worsen.   Final Clinical  Impressions(s) / UC Diagnoses   Final diagnoses:  None   Discharge Instructions   None    ED Prescriptions   None    PDMP not reviewed this encounter.   Valinda Hoar, NP 01/24/23 1248

## 2023-01-24 NOTE — ED Triage Notes (Signed)
Chief Complaint: Chills, Cough, Congestion,Headache   Onset: Wednesday   Prescriptions or OTC medications tried: Yes- tylenol, Nyquil, Dayquil     with little relief  Sick exposure: Yes- Patient's brother but no testing done.   New foods, medications, or products: No  Recent Travel: No

## 2023-06-07 ENCOUNTER — Encounter (HOSPITAL_COMMUNITY): Payer: Self-pay

## 2023-06-07 ENCOUNTER — Ambulatory Visit (HOSPITAL_COMMUNITY)
Admission: EM | Admit: 2023-06-07 | Discharge: 2023-06-07 | Disposition: A | Discharge status: Home / Self Care | Payer: Self-pay | Attending: Emergency Medicine | Admitting: Emergency Medicine

## 2023-06-07 DIAGNOSIS — R300 Dysuria: Secondary | ICD-10-CM

## 2023-06-07 DIAGNOSIS — B079 Viral wart, unspecified: Secondary | ICD-10-CM

## 2023-06-07 LAB — POCT URINALYSIS DIP (MANUAL ENTRY)
Bilirubin, UA: NEGATIVE
Blood, UA: NEGATIVE
Glucose, UA: NEGATIVE mg/dL
Ketones, POC UA: NEGATIVE mg/dL
Leukocytes, UA: NEGATIVE
Nitrite, UA: NEGATIVE
Protein Ur, POC: NEGATIVE mg/dL
Spec Grav, UA: 1.025 (ref 1.010–1.025)
Urobilinogen, UA: 0.2 U/dL
pH, UA: 5 (ref 5.0–8.0)

## 2023-06-07 NOTE — ED Provider Notes (Signed)
[facility]    CSN: [id number] Arrival date & time: 06/07/23  1003      History   Chief Complaint Chief Complaint  Patient presents with   Dysuria    HPI Riley Zavala is a 32 y.o. male.   Patient presents with 4-day history of painful urination, urgency, and pain in penis and testicles. Patient endorses some mild lower abdominal pain. Denies penile discharge, swelling, fever, flank pain, and blood in urine. Patient also reports bumps on his R index finger x2 years that has become progressively worse.    Dysuria Presenting symptoms: dysuria and penile pain   Presenting symptoms: no penile discharge   Associated symptoms: urinary frequency   Associated symptoms: no abdominal pain, no diarrhea, no fever, no flank pain, no hematuria, no nausea, no penile swelling, no scrotal swelling and no vomiting     Past Medical History:  Diagnosis Date   Dyspnea on exertion    Elevated liver enzymes    GERD (gastroesophageal reflux disease)    Hyperglycemia    Hypocalcemia    Ketonuria    Mass of hard palate    Prediabetes    Proteinuria    Right ventricular hypertrophy    Tonsillitis     There are no problems to display for this patient.   Past Surgical History:  Procedure Laterality Date   TONSILLECTOMY         Home Medications    Prior to Admission medications   Medication Sig Start Date End Date Taking? Authorizing Provider  metFORMIN (GLUCOPHAGE-XR) 500 MG 24 hr tablet Take 1 tablet by mouth daily. 05/05/21   [provider]  omeprazole (PRILOSEC) 40 MG capsule Take 1 capsule (40 mg total) by mouth in the morning and at bedtime. 01/07/22   Imogene Burn, MD    Family History Family History  Problem Relation Age of Onset   Diabetes Mother    Heart disease Mother    Colon cancer Neg Hx    Esophageal cancer Neg Hx    Pancreatic cancer Neg Hx    Stomach cancer Neg Hx    Liver disease Neg Hx     Social History Social History    Tobacco Use   Smoking status: Former   Smokeless tobacco: Never  Vaping Use   Vaping status: Never Used  Substance Use Topics   Alcohol use: Not Currently    Comment: d/c about 03/28/2021; previous heavy drinker   Drug use: Not Currently     Allergies   Patient has no known allergies.   Review of Systems Review of Systems  Constitutional:  Negative for activity change, chills, fatigue and fever.  Gastrointestinal:  Negative for abdominal pain, blood in stool, diarrhea, nausea and vomiting.  Genitourinary:  Positive for dysuria, frequency, penile pain, testicular pain and urgency. Negative for decreased urine volume, difficulty urinating, flank pain, genital sores, hematuria, penile discharge, penile swelling and scrotal swelling.  Musculoskeletal:  Negative for back pain.  Skin:  Negative for color change.  Neurological:  Negative for dizziness, weakness and light-headedness.     Physical Exam Triage Vital Signs ED Triage Vitals  Encounter Vitals Group     BP 06/07/23 1115 (!) 131/90     Systolic BP Percentile --      Diastolic BP Percentile --      Pulse Rate 06/07/23 1115 70     Resp 06/07/23 1115 16     Temp 06/07/23 1115 98.8 F (37.1 C)  Temp Source 06/07/23 1115 Oral     SpO2 06/07/23 1115 97 %     Weight 06/07/23 1114 228 lb (103.4 kg)     Height 06/07/23 1114 5\' 8"  (1.727 m)     Head Circumference --      Peak Flow --      Pain Score 06/07/23 1112 7     Pain Loc --      Pain Education --      Exclude from Growth Chart --    No data found.  Updated Vital Signs BP (!) 131/90 (BP Location: Left Arm)   Pulse 70   Temp 98.8 F (37.1 C) (Oral)   Resp 16   Ht 5\' 8"  (1.727 m)   Wt 228 lb (103.4 kg)   SpO2 97%   BMI 34.67 kg/m   Visual Acuity Right Eye Distance:   Left Eye Distance:   Bilateral Distance:    Right Eye Near:   Left Eye Near:    Bilateral Near:     Physical Exam Vitals and nursing note reviewed. Exam conducted with a chaperone  present.  Constitutional:      General: He is awake. He is not in acute distress.    Appearance: Normal appearance. He is well-developed and well-groomed. He is not ill-appearing, toxic-appearing or diaphoretic.  Cardiovascular:     Rate and Rhythm: Normal rate.     Heart sounds: Normal heart sounds.  Pulmonary:     Effort: Pulmonary effort is normal. No respiratory distress.     Breath sounds: Normal breath sounds. No wheezing.  Abdominal:     General: Abdomen is flat. Bowel sounds are normal. There is no distension.     Palpations: Abdomen is soft. There is no mass.     Tenderness: There is abdominal tenderness in the suprapubic area. There is no right CVA tenderness, left CVA tenderness, guarding or rebound. Negative signs include Murphy's sign, Rovsing's sign and McBurney's sign.     Hernia: No hernia is present.     Comments: Mild tenderness to suprapubic region.   Genitourinary:    Penis: Uncircumcised. Tenderness present. No erythema, discharge, swelling or lesions.      Testes: Normal.        Right: Mass, tenderness or swelling not present.        Left: Mass, tenderness or swelling not present.     CommentsGeraldine Contras CMA present for assessment.  Skin:    General: Skin is warm and dry.     Findings: Lesion present. No abrasion, bruising, burn, erythema, laceration, petechiae, rash or wound.     Comments: About 3 wart-like lesions to R index finger.   Neurological:     Mental Status: He is alert.  Psychiatric:        Behavior: Behavior is cooperative.      UC Treatments / Results  Labs (all labs ordered are listed, but only abnormal results are displayed) Labs Reviewed  URINE CULTURE  POCT URINALYSIS DIP (MANUAL ENTRY)  CYTOLOGY, (ORAL, ANAL, URETHRAL) ANCILLARY ONLY    EKG   Radiology No results found.  Procedures Procedures (including critical care time)  Medications Ordered in UC Medications - No data to display  Initial Impression / Assessment and Plan / UC  Course  I have reviewed the triage vital signs and the nursing notes.  Pertinent labs & imaging results that were available during my care of the patient were reviewed by me and considered in my medical decision making (  see chart for details).     Patient presented with 4-day history of painful urination, urgency, and penile and testicle pain. Endorses mild lower abdominal pain. Denies penile discharge, swelling, fever, flank pain, and hematuria. Patient also reports bumps to R index finger x2 years that have become progressively worse. Upon exam patient had mild tenderness to suprapubic region and shaft of penis. Denied pain to testes. UA in clinic was clear, but will send urine for culture. GC/Chlamydia swab performed during exam. Informed patient results would come back in the next few days and someone would call if results are positive and treatment plan needs to be adjusted. Recommended following up with a urologist if symptoms persist. Suggested using OTC wart medication first and returning here if symptoms persist. Discussed emergency department precautions.  Final Clinical Impressions(s) / UC Diagnoses   Final diagnoses:  Dysuria  Wart of hand     Discharge Instructions      We will send urine culture and STD swab to make sure he does not have any infection. Someone will call if results are positive and adjust treatment as needed. You can use over the counter wart medication for his finger. Otherwise he can take Tylenol and Ibuprofen to help with pain. Follow-up with urology if symptoms persist or you can return here as needed. Please go to emergency department if you develop high fevers, severe abdominal or back pain, and blood in urine.     ED Prescriptions   None    PDMP not reviewed this encounter.   Wynonia Lawman A, NP 06/07/23 [phone]

## 2023-06-07 NOTE — Discharge Instructions (Addendum)
We will send urine culture and STD swab to make sure he does not have any infection. Someone will call if results are positive and adjust treatment as needed. You can use over the counter wart medication for his finger. Otherwise he can take Tylenol and Ibuprofen to help with pain. Follow-up with urology if symptoms persist or you can return here as needed. Please go to emergency department if you develop high fevers, severe abdominal or back pain, and blood in urine.

## 2023-06-07 NOTE — ED Triage Notes (Signed)
Patient here today with c/o burning in urination, urgency, and pressure in scrotum X 4 days.   Patient would also like to have his right index finger looked at. He noticed some bumps on his finger for 2 years now. It started out small but has been getting bigger.

## 2023-06-08 LAB — CYTOLOGY, (ORAL, ANAL, URETHRAL) ANCILLARY ONLY
Chlamydia: NEGATIVE
Comment: NEGATIVE
Comment: NEGATIVE
Comment: NORMAL
Neisseria Gonorrhea: NEGATIVE
Trichomonas: NEGATIVE

## 2023-06-08 LAB — URINE CULTURE: Culture: NO GROWTH

## 2023-06-28 ENCOUNTER — Ambulatory Visit
Admission: EM | Admit: 2023-06-28 | Discharge: 2023-06-28 | Disposition: A | Discharge status: Home / Self Care | Payer: Self-pay | Attending: Internal Medicine | Admitting: Internal Medicine

## 2023-06-28 DIAGNOSIS — R300 Dysuria: Secondary | ICD-10-CM | POA: Insufficient documentation

## 2023-06-28 LAB — POCT URINALYSIS DIP (MANUAL ENTRY)
Bilirubin, UA: NEGATIVE
Blood, UA: NEGATIVE
Glucose, UA: NEGATIVE mg/dL
Ketones, POC UA: NEGATIVE mg/dL
Leukocytes, UA: NEGATIVE
Nitrite, UA: NEGATIVE
Protein Ur, POC: NEGATIVE mg/dL
Spec Grav, UA: 1.01 (ref 1.010–1.025)
Urobilinogen, UA: 0.2 U/dL
pH, UA: 6 (ref 5.0–8.0)

## 2023-06-28 MED ORDER — DOXYCYCLINE HYCLATE 100 MG PO CAPS
100.0000 mg | ORAL_CAPSULE | Freq: Two times a day (BID) | ORAL | 0 refills | Status: AC
Start: 2023-06-28 — End: 2023-07-05

## 2023-06-28 NOTE — ED Triage Notes (Signed)
  Pt c/o burning sensation when voiding X 1 month.   Pt also c/o lower abd pain.

## 2023-06-28 NOTE — Discharge Instructions (Signed)
The clinic will contact you with the results of the urine culture done today.  Start doxycycline twice daily for 7 days.  Please follow-up with urology for further workup of your symptoms.  Lots of rest and fluids.  Please go to the emergency room if you develop any worsening symptoms.  I hope you feel better soon!

## 2023-06-28 NOTE — ED Provider Notes (Signed)
[facility]    CSN: [id number] Arrival date & time: 06/28/23  1857      History   Chief Complaint No chief complaint on file.   HPI Riley Zavala is a 32 y.o. male presents for dysuria.  Patient accompanied by family member who does help to translate as patient speaks primarily [place].  Pt reports one month of burning with urination.  He denies any fevers, nausea/vomiting, flank pain.  No penile discharge or testicular pain or swelling.  No difficulty starting or stopping his urine stream.  He has been seen twice for this since symptoms began.  He was seen in urgent care on 9/9 for dysuria had a negative UA, urine culture, and STD testing.  He then saw his PCP on 9/16 where they repeated the UA that was normal and also sent a urine culture that was normal as well.  He was advised he may need urology follow-up if symptoms do not improve.  He has not taken any OTC medications for symptoms.  No other concerns at this time.  HPI  Past Medical History:  Diagnosis Date   Dyspnea on exertion    Elevated liver enzymes    GERD (gastroesophageal reflux disease)    Hyperglycemia    Hypocalcemia    Ketonuria    Mass of hard palate    Prediabetes    Proteinuria    Right ventricular hypertrophy    Tonsillitis     There are no problems to display for this patient.   Past Surgical History:  Procedure Laterality Date   TONSILLECTOMY         Home Medications    Prior to Admission medications   Medication Sig Start Date End Date Taking? Authorizing Provider  doxycycline (VIBRAMYCIN) 100 MG capsule Take 1 capsule (100 mg total) by mouth 2 (two) times daily for 7 days. 06/28/23 07/05/23 Yes Radford Pax, NP  metFORMIN (GLUCOPHAGE-XR) 500 MG 24 hr tablet Take 1 tablet by mouth daily. 05/05/21   [provider]  omeprazole (PRILOSEC) 40 MG capsule Take 1 capsule (40 mg total) by mouth in the morning and at bedtime. 01/07/22   Imogene Burn, MD    Family  History Family History  Problem Relation Age of Onset   Diabetes Mother    Heart disease Mother    Colon cancer Neg Hx    Esophageal cancer Neg Hx    Pancreatic cancer Neg Hx    Stomach cancer Neg Hx    Liver disease Neg Hx     Social History Social History   Tobacco Use   Smoking status: Former   Smokeless tobacco: Never  Vaping Use   Vaping status: Never Used  Substance Use Topics   Alcohol use: Not Currently    Comment: d/c about 03/28/2021; previous heavy drinker   Drug use: Not Currently     Allergies   Patient has no known allergies.   Review of Systems Review of Systems  Genitourinary:  Positive for dysuria.     Physical Exam Triage Vital Signs ED Triage Vitals  Encounter Vitals Group     BP 06/28/23 1911 (!) 168/79     Systolic BP Percentile --      Diastolic BP Percentile --      Pulse Rate 06/28/23 1910 67     Resp 06/28/23 1910 17     Temp 06/28/23 1922 98 F (36.7 C)     Temp Source 06/28/23 1911 Oral  SpO2 06/28/23 1910 95 %     Weight --      Height --      Head Circumference --      Peak Flow --      Pain Score 06/28/23 1909 4     Pain Loc --      Pain Education --      Exclude from Growth Chart --    No data found.  Updated Vital Signs BP (!) 168/79 (BP Location: Right Arm)   Pulse 67   Temp 98 F (36.7 C)   Resp 17   SpO2 95%   Visual Acuity Right Eye Distance:   Left Eye Distance:   Bilateral Distance:    Right Eye Near:   Left Eye Near:    Bilateral Near:     Physical Exam Vitals and nursing note reviewed.  Constitutional:      General: He is not in acute distress.    Appearance: Normal appearance. He is not ill-appearing.  HENT:     Head: Normocephalic and atraumatic.  Eyes:     Pupils: Pupils are equal, round, and reactive to light.  Cardiovascular:     Rate and Rhythm: Normal rate.  Pulmonary:     Effort: Pulmonary effort is normal.  Abdominal:     Tenderness: There is no right CVA tenderness or left  CVA tenderness.  Skin:    General: Skin is warm and dry.  Neurological:     General: No focal deficit present.     Mental Status: He is alert and oriented to person, place, and time.  Psychiatric:        Mood and Affect: Mood normal.        Behavior: Behavior normal.      UC Treatments / Results  Labs (all labs ordered are listed, but only abnormal results are displayed) Labs Reviewed  URINE CULTURE  POCT URINALYSIS DIP (MANUAL ENTRY)    EKG   Radiology No results found.  Procedures Procedures (including critical care time)  Medications Ordered in UC Medications - No data to display  Initial Impression / Assessment and Plan / UC Course  I have reviewed the triage vital signs and the nursing notes.  Pertinent labs & imaging results that were available during my care of the patient were reviewed by me and considered in my medical decision making (see chart for details).     Reviewed exam and symptoms with patient.  UA negative.  Will send urine culture.  Given persistent symptoms we will start doxycycline and refer to urology for follow-up.  Advised to continue to rest and increase fluids.  ER precautions reviewed and patient verbalized understanding. Final Clinical Impressions(s) / UC Diagnoses   Final diagnoses:  Dysuria     Discharge Instructions      The clinic will contact you with the results of the urine culture done today.  Start doxycycline twice daily for 7 days.  Please follow-up with urology for further workup of your symptoms.  Lots of rest and fluids.  Please go to the emergency room if you develop any worsening symptoms.  I hope you feel better soon!   ED Prescriptions     Medication Sig Dispense Auth. Provider   doxycycline (VIBRAMYCIN) 100 MG capsule Take 1 capsule (100 mg total) by mouth 2 (two) times daily for 7 days. 14 capsule Radford Pax, NP      PDMP not reviewed this encounter.   Radford Pax, NP  06/28/23 1935  

## 2023-06-30 LAB — URINE CULTURE: Culture: NO GROWTH

## 2023-09-28 IMAGING — CT CT ABD-PELV W/ CM
2 of 4 series · 17 of 46 positions shown, 19 images · IV contrast (Omni 300)
Comparison: CT abdomen pelvis 05/25/2011

CLINICAL DATA: Diverticulitis suspected.  Abdominal pain.

EXAM:
CT ABDOMEN AND PELVIS WITH CONTRAST
TECHNIQUE: Multidetector CT imaging of the abdomen and pelvis was performed
using the standard protocol following bolus administration of
intravenous contrast.
CONTRAST:  80mL OMNIPAQUE IOHEXOL 350 MG/ML SOLN

[Series 3: a/p w/ 5mm · axial · 0.83mm/px · z∈[+858,+1273]mm · 14 of 91 slices shown, 16 images]
[im 4/91  soft-tissue]
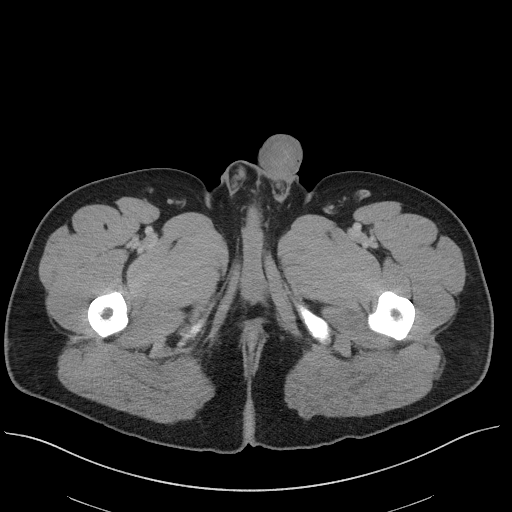
[im 4/91  bone]
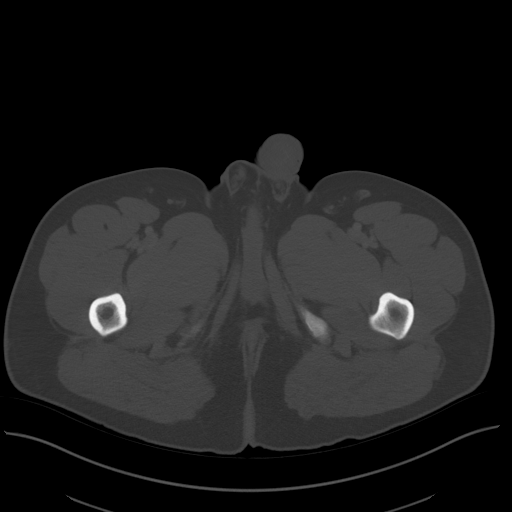
[im 11/91  soft-tissue]
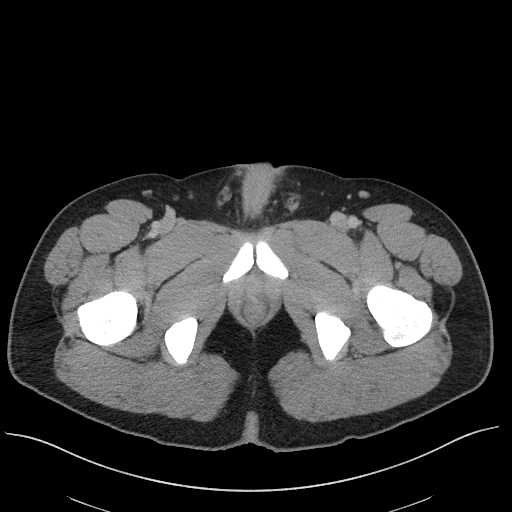
[im 19/91  soft-tissue]
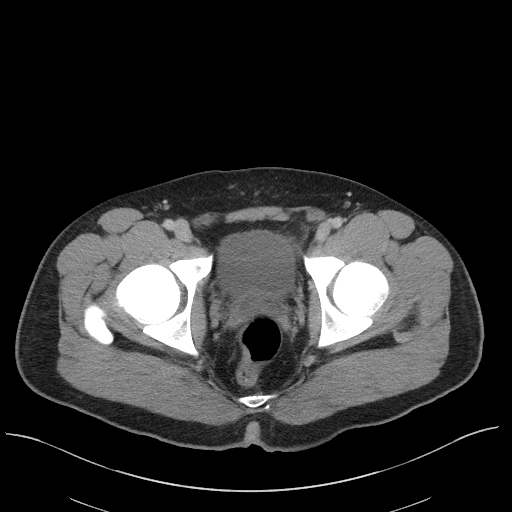
[im 26/91  soft-tissue]
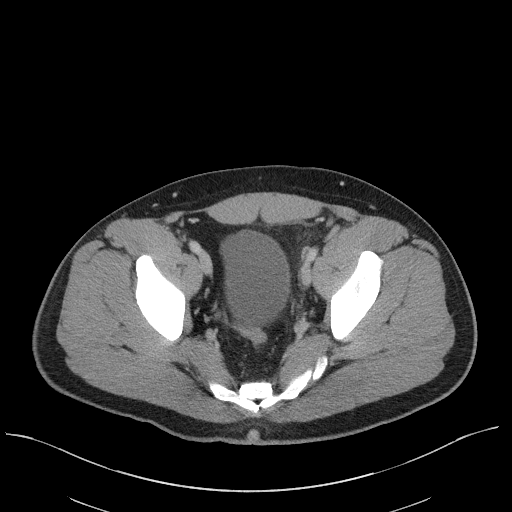
[im 29/91  soft-tissue]
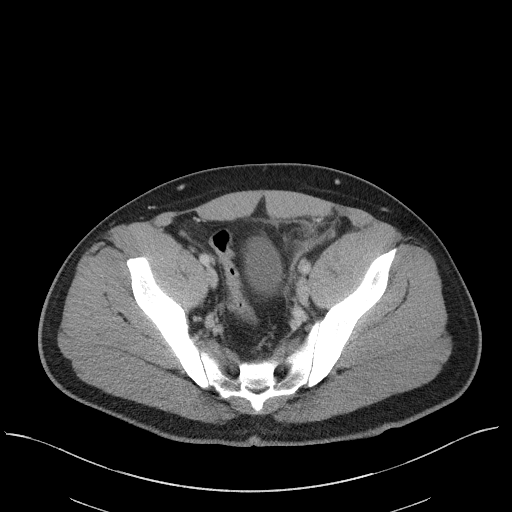
[im 37/91  soft-tissue]
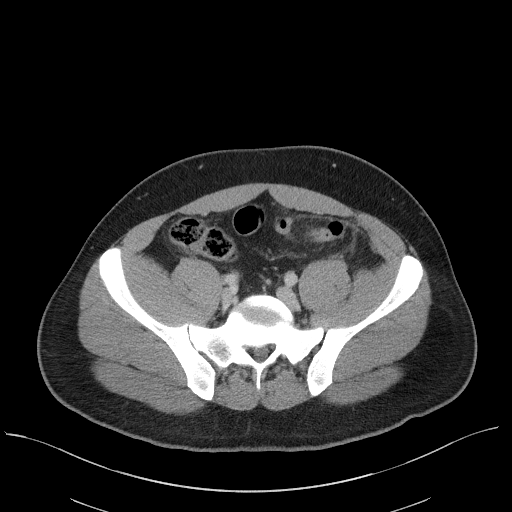
[im 44/91  soft-tissue]
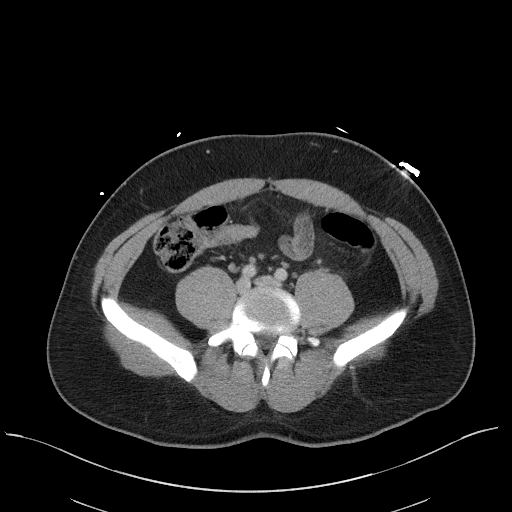
[im 47/91  soft-tissue]
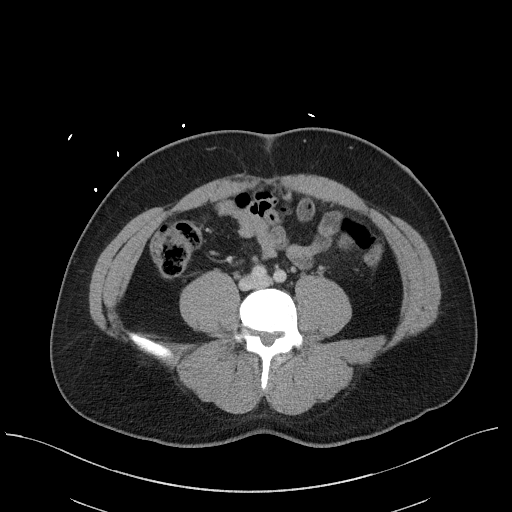
[im 55/91  soft-tissue]
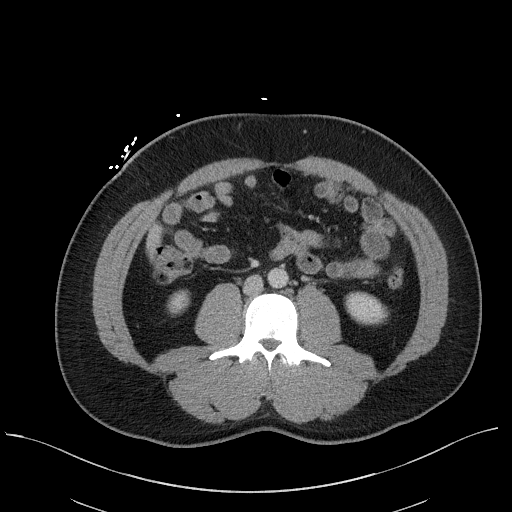
[im 55/91  bone]
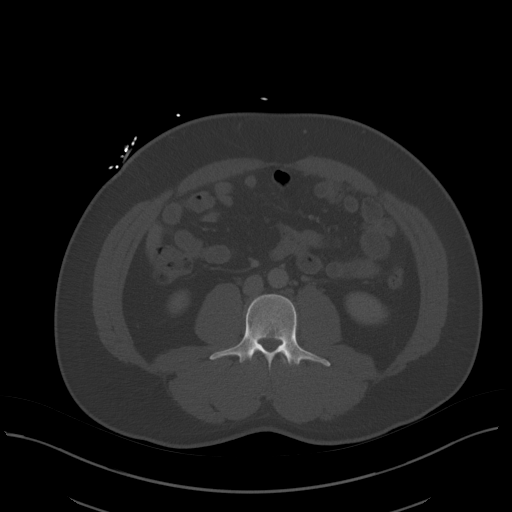
[im 62/91  soft-tissue]
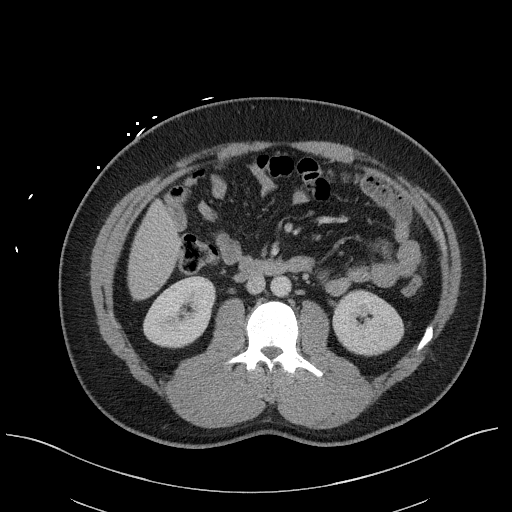
[im 69/91  soft-tissue]
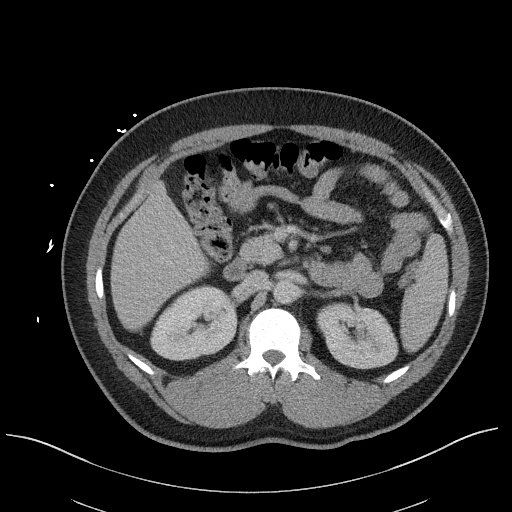
[im 73/91  soft-tissue]
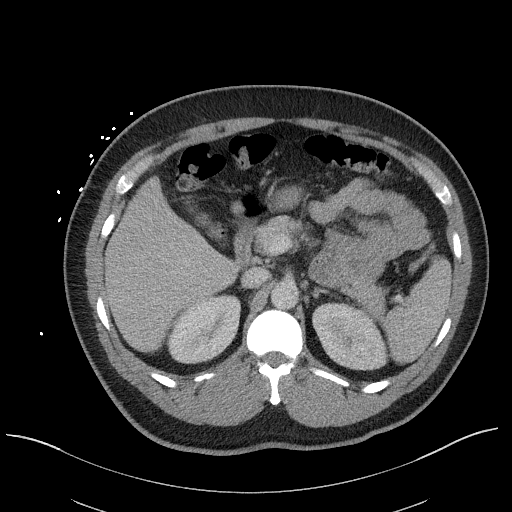
[im 80/91  soft-tissue]
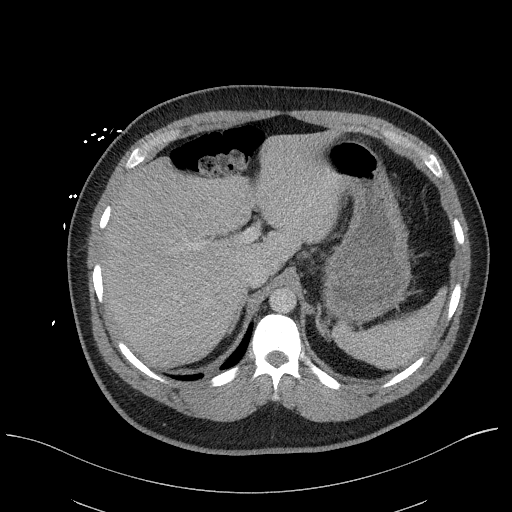
[im 87/91  soft-tissue]
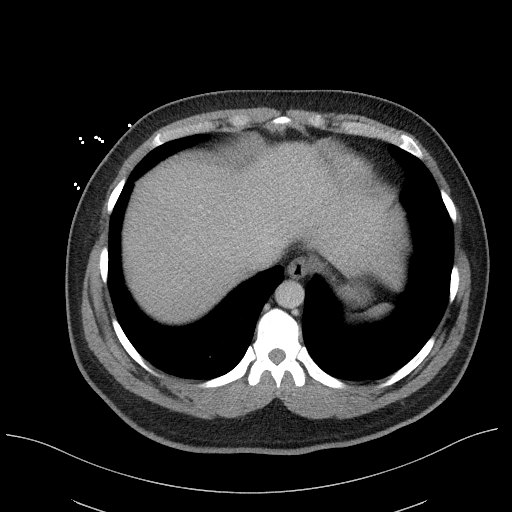

[Series 6: a/p w/ cor · coronal · 0.88mm/px · 3 of 158 slices shown]
[im 53/158  soft-tissue]
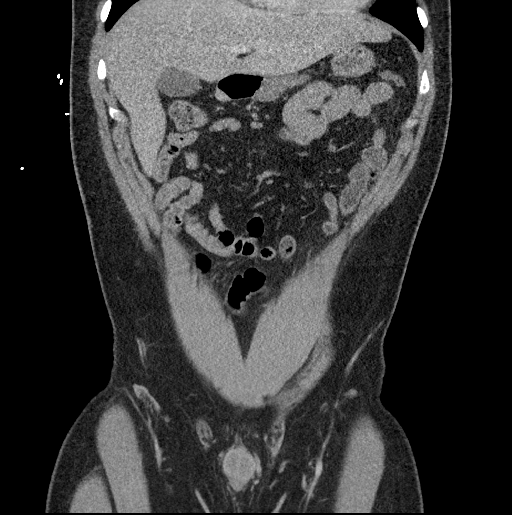
[im 70/158  soft-tissue]
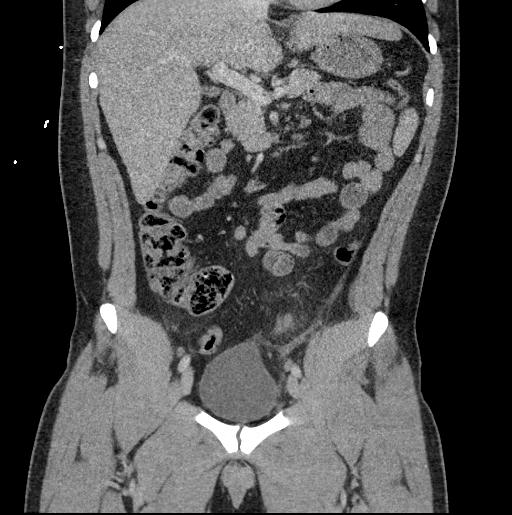
[im 88/158  soft-tissue]
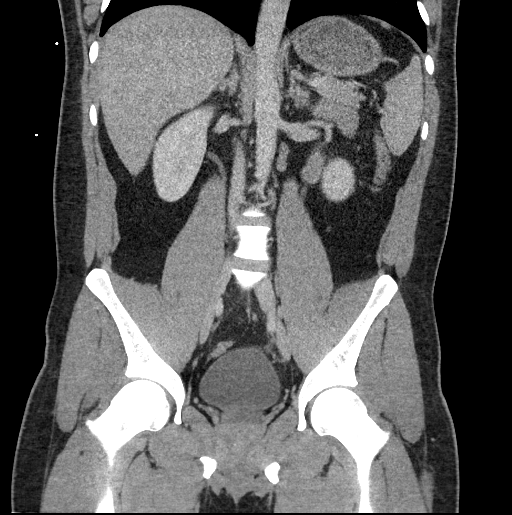

[17 of 46 positions shown; findings below may reference images not displayed]

FINDINGS: Lower chest: No acute abnormality.

Hepatobiliary: No focal liver abnormality. No gallstones,
gallbladder wall thickening, or pericholecystic fluid. No biliary
dilatation.

Pancreas: No focal lesion. Normal pancreatic contour. No surrounding
inflammatory changes. No main pancreatic ductal dilatation.

Spleen: Normal in size without focal abnormality.

Adrenals/Urinary Tract:

No adrenal nodule bilaterally.

Bilateral kidneys enhance symmetrically.

No hydronephrosis. No hydroureter.

The urinary bladder is unremarkable.

Stomach/Bowel: Stomach is within normal limits. No evidence of bowel
wall thickening or dilatation. Pericolonic fat stranding along the
proximal sigmoid colon with associated centrally a fat density ovoid
structure measuring approximately 2.4 cm. Appendix appears normal.

Vascular/Lymphatic: No abdominal aorta or iliac aneurysm. No
abdominal, pelvic, or inguinal lymphadenopathy.

Reproductive: Prostate is unremarkable.

Other: Trace free fluid within the left lower abdomen. No
intraperitoneal free gas. No organized fluid collection.

Musculoskeletal:

No abdominal wall hernia or abnormality.

No suspicious lytic or blastic osseous lesions. No acute displaced
fracture.
IMPRESSION: Epiploic appendagitis along the proximal sigmoid colon.
